# Patient Record
Sex: Male | Born: 1950 | Race: Asian | Hispanic: No | Marital: Married | Smoking: Former smoker
Health system: Southern US, Community
[De-identification: ages and names within clinical notes are randomized; demographics above are authoritative.]

## PROBLEM LIST (undated history)

## (undated) DIAGNOSIS — J189 Pneumonia, unspecified organism: Secondary | ICD-10-CM

## (undated) DIAGNOSIS — E785 Hyperlipidemia, unspecified: Secondary | ICD-10-CM

## (undated) DIAGNOSIS — I2510 Atherosclerotic heart disease of native coronary artery without angina pectoris: Secondary | ICD-10-CM

---

## 2001-09-28 ENCOUNTER — Encounter: Payer: Self-pay | Admitting: Family Medicine

## 2001-09-28 ENCOUNTER — Ambulatory Visit (HOSPITAL_COMMUNITY): Admission: RE | Admit: 2001-09-28 | Discharge: 2001-09-28 | Payer: Self-pay | Admitting: Family Medicine

## 2003-04-08 ENCOUNTER — Encounter: Payer: Self-pay | Admitting: Emergency Medicine

## 2003-04-08 ENCOUNTER — Emergency Department (HOSPITAL_COMMUNITY): Admission: EM | Admit: 2003-04-08 | Discharge: 2003-04-08 | Payer: Self-pay | Admitting: Emergency Medicine

## 2004-11-10 HISTORY — PX: CORONARY ANGIOPLASTY WITH STENT PLACEMENT: SHX49

## 2005-07-10 ENCOUNTER — Ambulatory Visit: Payer: Self-pay | Admitting: Internal Medicine

## 2005-07-22 ENCOUNTER — Ambulatory Visit: Payer: Self-pay | Admitting: Cardiology

## 2005-07-22 ENCOUNTER — Encounter: Payer: Self-pay | Admitting: Emergency Medicine

## 2005-07-23 ENCOUNTER — Inpatient Hospital Stay (HOSPITAL_COMMUNITY): Admission: AD | Admit: 2005-07-23 | Discharge: 2005-07-25 | Payer: Self-pay | Admitting: Cardiology

## 2005-07-23 ENCOUNTER — Ambulatory Visit: Payer: Self-pay | Admitting: Cardiology

## 2005-07-23 ENCOUNTER — Encounter: Payer: Self-pay | Admitting: Cardiology

## 2005-07-28 ENCOUNTER — Ambulatory Visit: Payer: Self-pay | Admitting: Internal Medicine

## 2005-08-05 ENCOUNTER — Ambulatory Visit: Payer: Self-pay | Admitting: Family Medicine

## 2005-08-08 ENCOUNTER — Ambulatory Visit: Payer: Self-pay | Admitting: Internal Medicine

## 2005-08-11 ENCOUNTER — Ambulatory Visit: Payer: Self-pay | Admitting: Internal Medicine

## 2007-12-01 ENCOUNTER — Ambulatory Visit: Payer: Self-pay | Admitting: Internal Medicine

## 2007-12-01 DIAGNOSIS — F1010 Alcohol abuse, uncomplicated: Secondary | ICD-10-CM | POA: Insufficient documentation

## 2007-12-01 DIAGNOSIS — E785 Hyperlipidemia, unspecified: Secondary | ICD-10-CM

## 2007-12-01 DIAGNOSIS — L259 Unspecified contact dermatitis, unspecified cause: Secondary | ICD-10-CM

## 2007-12-01 DIAGNOSIS — I2510 Atherosclerotic heart disease of native coronary artery without angina pectoris: Secondary | ICD-10-CM | POA: Insufficient documentation

## 2010-11-30 ENCOUNTER — Encounter: Payer: Self-pay | Admitting: Internal Medicine

## 2011-03-28 NOTE — H&P (Signed)
[name], SEBESTA NO.:  [id number]   MEDICAL RECORD NO.:  [id number]          PATIENT TYPE:  EMS   LOCATION:  ED                           FACILITY:  [facility]   PHYSICIAN:  Learta Codding, M.D. LHCDATE OF BIRTH:  1951/02/24   DATE OF ADMISSION:  07/22/2005  DATE OF DISCHARGE:                                HISTORY & PHYSICAL   REFERRING PHYSICIAN:  [name], M.D.   CARDIOLOGIST:  Learta Codding, M.D.   REASON FOR CONSULTATION:  Evaluation of heartburn in a 60 year old  [place] with marked EKG changes consistent with high risk anatomy.   HISTORY OF PRESENT ILLNESS:  The patient is a 60 year old [place] male  living in the [place] over the last 16 years.  The patient presents to  the emergency room with his wife and son.  The wife is a patient of Dr. Drue Novel.  His wife has been rather concerned about him over the last several years  because of ongoing and now accelerating symptoms of heartburn.  The  patient states that ever since an admission in 2004 at [facility] when he  presented with alcoholic intoxication and abdominal pain, he has been  experiencing intermittent heartburn.  He reports that this can occur in the  middle of the night while not doing anything and waking him up out of his  sleep.  Also, there is no associated nausea or vomiting.  There is also no  diaphoresis.  Interestingly, the patient states that when he lifts heavy  materials, he does not experience this problem, but when he mops floors or  walks a flight of stairs, he often experiences these symptoms of heartburn.  They typically last up to 30 minutes, sometimes even up to an hour and tend  to go away with rest.  He never really sought medical attention for this  problem over the last several years, short of his admission in 2004.  He did  see Dr. Drue Novel a few weeks ago and had some blood work done with a markedly  elevated LDL.  It was felt the patient was experiencing  gastroesophageal  reflux but is not on any specific treatment.  Today in the emergency room,  he states that his last episode of heartburn was yesterday, which was rather  prolonged.  This occurred after eating a lot of hot peppers, according to  his wife; however, his EKG today showed marked EKG change with T wave  inversion across the anterior precordial leads but not in the inferior  leads, consistent with high risk anatomy with proximal LAD disease.  The  patient is currently painfree.  He also on admission has a positive troponin  point-of-care marker.   ALLERGIES:  No known drug allergies.   MEDICATIONS:  None.   FAMILY HISTORY:  The patient has no family history of premature coronary  artery disease.   SOCIAL HISTORY:  The patient is a Designer, fashion/clothing.  He smokes at least a pack a day,  sometimes up to a pack and a half.  He has been  doing so for many years.  He also has a history of heavy alcohol consumption and drinks up to two  cases a day over the last several years.  The patient has one son who in  good health.   REVIEW OF SYSTEMS:  As in HPI.  No nausea, vomiting, heartburn.  No definite  chest pain.  No exertional dyspnea.  No orthopnea or PND.  No palpitations  or syncope.  Significant weight loss, according to the patient's wife, with  positive dysphagia and decreased taste of foods.  He denies any dysuria or  frequency.  He has no melena or hematochezia.  He denies any neurological  symptoms.  Review of systems is otherwise negative.   PHYSICAL EXAMINATION:  VITAL SIGNS:  Blood pressure 124/70, heart rate 75  beats per minute.  GENERAL:  A somewhat cachectic-appearing [place] male in no distress.  HEENT:  Pupils are equal and reactive to light.  Conjunctivae are clear.  NECK:  Supple.  No carotid upstroke.  No carotid bruits.  LUNGS:  Clear bilaterally.  HEART:  Regular rate and rhythm with a soft systolic murmur at the right  upper sternal border.  Normal S1 and S2.   No definite S3 or S4.  No  pathological murmurs.  PMI is nondisplaced.  ABDOMEN:  Soft and nontender.  There is no rebound or guarding.  There are  good bowel sounds.  No hepatosplenomegaly.  EXTREMITIES:  No clubbing, cyanosis or edema.  Peripheral pulses are 2+.  Femoral pulses are 2+ as well as dorsalis pedis and posterior tibial.  NEUROLOGIC:  Grossly nonfocal.   A 12-lead electrocardiogram showed a normal sinus rhythm with marked T wave  changes across the anterior precordial leads.  See description above.   LABS:  LDL is 146.  Hemoglobin is 14.3, white count 6.9, platelet count 224.  Sodium 133, potassium 3.4, chloride 100, CO2 26, glucose 100, INR 0.8, BUN  13, creatinine 1.  Total bilirubin 0.6.  Alkaline phosphatase 62.  SGOT 40.  SGPT 19.  Total protein 6.4, albumin 3.4, calcium 8.8, lipase 20.  Troponin  0.35.  Triglycerides 105.  HDL 62.2.   PROBLEM LIST:  1.  Atypical chest pain with heartburn.      1.  Marked electrocardiogram changes with T wave inversion in the          anterior precordial leads.  ([place])      2.  Unlikely to represent apical hypertrophic cardiomyopathy, given the          lack of EKG changes in the inferior leads and marked changes          compared to an ECG from 2004.      3.  Positive cardiac troponin.  (?NSTEMI days ago).  2.  Heavy alcohol consumption.      1.  Two cases of beer a day.      2.  Elevated LFTs.      3.  Admission for abdominal pain in 2004.  3.  Dyslipidemia.  4.  Heavy tobacco use.  5.  Weight loss and dysphagia.      1.  Rule out GI pathology.   PLAN:  1.  The patient has an EKG that is consistent with high-risk coronary      anatomy.  He will be admitted for further evaluation.  2.  Patient will be started on aspirin, heparin, nitro paste, and beta      blocker therapy.  He  will also be placed on telemetry. 3.  Cardiac catheterization in the morning.  The risks and benefits of this      procedure were  discussed with the      patient and his wife, who acted as a Nurse, learning disability, and they both agreed to      proceed to procedure in the morning.  4.  Further GI evaluation as outpatient  may be indicated with weight loss,      dysphagia and abnormal LFT's.  Can be coordinated through Dr. Drue Novel.      Learta Codding, M.D. [facility]  Electronically Signed     GED/MEDQ  D:  07/22/2005  T:  07/22/2005  Job:  [id number]   cc:   [name], MD [facility]  [phone] [address] [place], [place] [address]

## 2011-03-28 NOTE — Discharge Summary (Signed)
NAMEROSE, HIPPLER NO.:  [id number]   MEDICAL RECORD NO.:  [id number]          PATIENT TYPE:  INP   LOCATION:  6523                         FACILITY:  [facility]   PHYSICIAN:  Charlies Constable, M.D. [facility] DATE OF BIRTH:  Nov 03, 1951   DATE OF ADMISSION:  07/23/2005  DATE OF DISCHARGE:                                 DISCHARGE SUMMARY   PRIMARY CARE PHYSICIAN:  [name], MD.   DISCHARGE DIAGNOSES:  Coronary artery disease, status post stent to the  proximal left anterior descending artery; decreasing stenosis from 90% to  less than 10% (using a bare metal stent).  Recent non-ST segment elevation  myocardial infarction, with a 90% stenosis in the proximal left anterior  descending artery, 30% narrowing in the proximal circumflex artery with a  50% narrowing in the proximal branch of the circumflex artery.  Nondominant  right coronary artery and a normal left ventricular function.   PAST MEDICAL HISTORY:  1.  Heavy alcohol consumption in the form of 2 cases of beer a day, with      elevated LFTs.  2.  Dyslipidemia.  3.  Heavy tobacco use.  4.  Weight loss.  5.  Dysphagia.   Further TIA evaluation as an outpatient may be indicated with weight loss  dysphagia and abnormal LFTs.  This can be coordinated through Dr. Drue Novel.   ALLERGIES:  NO KNOWN DRUG ALLERGIES.   HOSPITAL COURSE:  Mr. Pandit is a 59 year old [place] gentleman living in  the [place] for the last 16 years.  Recently he was treated for chest  pain with an EKG that showed inferior and lateral P-wave changes, with a  positive Troponin.  The patient was taken to the catheterization lab by Dr.  Charlies Constable on July 24, 2005; results as stated above.  The patient  tolerated the procedure without complications.  He is being discharged home.   FOLLOW-UP:  Ok Anis, NP on August 08, 2005 at 10:30 a.m.   LAB WORK THIS ADMISSION:  Last hemoglobin 14.0, hematocrit 42.0.  The  patient  with positive point-of-care enzymes (Troponin at 0.74 and a relative  index of 2.6).  Troponin peaked at 1.07.  Other lab work includes:  Potassium 3.4 initially, glucose 100, BUN 13, creatinine 1.0, total  bilirubin 0.6.  ASGOT 40, ASGPT 19.   DISCHARGE MEDICATIONS:  1.  Plavix 75 mg daily.  2.  Aspirin 325 mg daily.  3.  Lopressor 25 mg 1/2 tablet p.o. b.i.d.  4.  Simvastatin 40 mg daily.   SPECIAL INSTRUCTIONS:  The patient has also been given the [facility] heart  care discharge instructions.  He has been told to call our office if he has  any problems with his catheterization site.  I have also written an order  for cardiac rehabilitation phase II.      Dorian Pod, NP    ______________________________  Charlies Constable, M.D. [facility]    MB/MEDQ  D:  07/25/2005  T:  07/25/2005  Job:  [id number]   cc:   [name], MD [facility]  [address] [place], [place] [address]

## 2011-03-28 NOTE — Cardiovascular Report (Signed)
[name], [name] NO.:  [id number]   MEDICAL RECORD NO.:  [id number]          PATIENT TYPE:  INP   LOCATION:  6523                         FACILITY:  [facility]   PHYSICIAN:  Charlies Constable, M.D. [facility] DATE OF BIRTH:  1951/07/26   DATE OF PROCEDURE:  07/24/2005  DATE OF DISCHARGE:                              CARDIAC CATHETERIZATION   CLINICAL HISTORY:  Mr. Hardy is 60 years old and has no prior history of  known heart disease.  He was admitted to [facility] Emergency Room  with chest pain and an ECG that showed inferior and lateral P-wave changes  and he had a positive troponin.  He was seen in consultation by Learta Codding, M.D., and scheduled for catheterization today.  He also has heavy  alcohol consumption of up to two cases of beer per day.  He has had abnormal  liver function tests and heavy tobacco use.  He came to the [place]  from [place] 16 years ago.   PROCEDURE:  The procedure was performed via the right femoral arteries and  arterial sheath and 6 French preformed coronary catheters.  A front wall  arterial puncture was performed and Omnipaque contrast was used.  After  completion of the diagnostic study, we decided to proceed with intervention  on the left anterior descending artery lesion.   Patient was given Angiomax bolus and infusion.  We gave 600 mg of Plavix but  did not remember to give it until the end of the procedure.  We used a 6  [place] Q4 guiding catheter with side holes and a Prowater wire.  We crossed  the lesion in the proximal LAD with the wire without difficulty.  We direct  stented with a 3.0 x 15 mm VISION stent deploying this with one inflation of  16 atmospheres for 30 seconds.  We then post dilated with a 3.5 x 12 mm  Quantum Maverick performing two inflations of 16 atmospheres for 30 seconds.  Final diagnostic study was then performed through the guiding catheter.  Patient tolerated the procedure well and left  the laboratory in satisfactory  condition. We did not close the right femoral artery at the end of the  procedure.   RESULTS:  The aortic pressure was 105/66 with a mean of 82.  Left  ventricular pressure was 105/10.   Left main coronary artery:  Left main coronary artery was free of  significant disease.   Left anterior descending:  The left anterior descending artery gave rise to  three diagonal branches and three septal perforators.  There was a 90%  stenosis in the proximal LAD after the first diagonal branch located at the  first septal perforator.   Circumflex artery:  The circumflex artery was a dominant vessel that gave  rise to a large marginal branch, posterolateral branch and a posterior  descending branch.  There was 30% proximal stenosis in the circumflex  artery.  There was 50% narrowing in the marginal branch and one of its  subbranches.   Right coronary artery:  The right  coronary artery was a nondominant vessel  that was free of major obstruction.   Left ventriculogram:  The left ventriculogram was performed in the RAO  projection, showed good wall motion with no areas of  hypokinesis.  The  estimated ejection fraction was 60%.   Following stenting of the lesion in the proximal LAD, the stenosis improved  from 90% to less than 10%.   CONCLUSION:  1.  Coronary artery disease with recent non-ST segment elevation myocardial      infarction with 90% stenosis in proximal left anterior descending      artery, 30% narrowing in the proximal circumflex artery with 50%      narrowing in the proximal branch of the circumflex artery, a nondominant      right coronary artery and normal left ventricular function.  2.  Successful percutaneous coronary intervention of the lesion in the      proximal left anterior descending using a bare metal stent with      improvement in percent of narrowing from 90% to less than 10%.   DISPOSITION:  Patient taken to the post anesthesia  care unit for further  observation.  We chose the bare metal stent because the vessel size was  large and because we were concerned about compliance with history of alcohol  abuse.           ______________________________  Charlies Constable, M.D. [facility]     BB/MEDQ  D:  07/24/2005  T:  07/24/2005  Job:  [id number]   cc:   [name], MD [facility]  [phone] [place], [place] [address]   Cardiopulmonary Lab

## 2016-02-15 ENCOUNTER — Emergency Department (HOSPITAL_COMMUNITY): Payer: Managed Care, Other (non HMO)

## 2016-02-15 ENCOUNTER — Inpatient Hospital Stay (HOSPITAL_COMMUNITY)
Admission: EM | Admit: 2016-02-15 | Discharge: 2016-02-19 | DRG: 190 | Disposition: A | Discharge status: Home / Self Care | Payer: Managed Care, Other (non HMO) | Attending: Family Medicine | Admitting: Family Medicine

## 2016-02-15 ENCOUNTER — Encounter (HOSPITAL_COMMUNITY): Payer: Self-pay | Admitting: Emergency Medicine

## 2016-02-15 DIAGNOSIS — J449 Chronic obstructive pulmonary disease, unspecified: Secondary | ICD-10-CM

## 2016-02-15 DIAGNOSIS — R0902 Hypoxemia: Secondary | ICD-10-CM | POA: Diagnosis not present

## 2016-02-15 DIAGNOSIS — Z681 Body mass index (BMI) 19 or less, adult: Secondary | ICD-10-CM

## 2016-02-15 DIAGNOSIS — J441 Chronic obstructive pulmonary disease with (acute) exacerbation: Principal | ICD-10-CM | POA: Diagnosis present

## 2016-02-15 DIAGNOSIS — I2510 Atherosclerotic heart disease of native coronary artery without angina pectoris: Secondary | ICD-10-CM | POA: Diagnosis not present

## 2016-02-15 DIAGNOSIS — J9601 Acute respiratory failure with hypoxia: Secondary | ICD-10-CM | POA: Diagnosis present

## 2016-02-15 DIAGNOSIS — D696 Thrombocytopenia, unspecified: Secondary | ICD-10-CM | POA: Diagnosis present

## 2016-02-15 DIAGNOSIS — I10 Essential (primary) hypertension: Secondary | ICD-10-CM | POA: Diagnosis present

## 2016-02-15 DIAGNOSIS — E43 Unspecified severe protein-calorie malnutrition: Secondary | ICD-10-CM | POA: Insufficient documentation

## 2016-02-15 DIAGNOSIS — E785 Hyperlipidemia, unspecified: Secondary | ICD-10-CM | POA: Diagnosis present

## 2016-02-15 DIAGNOSIS — F17200 Nicotine dependence, unspecified, uncomplicated: Secondary | ICD-10-CM | POA: Diagnosis present

## 2016-02-15 DIAGNOSIS — J189 Pneumonia, unspecified organism: Secondary | ICD-10-CM | POA: Diagnosis not present

## 2016-02-15 DIAGNOSIS — Z23 Encounter for immunization: Secondary | ICD-10-CM

## 2016-02-15 HISTORY — DX: Pneumonia, unspecified organism: J18.9

## 2016-02-15 HISTORY — DX: Atherosclerotic heart disease of native coronary artery without angina pectoris: I25.10

## 2016-02-15 HISTORY — DX: Hyperlipidemia, unspecified: E78.5

## 2016-02-15 LAB — HEPATIC FUNCTION PANEL
ALBUMIN: 3.7 g/dL (ref 3.5–5.0)
ALT: 15 U/L — AB (ref 17–63)
AST: 27 U/L (ref 15–41)
Alkaline Phosphatase: 56 U/L (ref 38–126)
BILIRUBIN INDIRECT: 0.6 mg/dL (ref 0.3–0.9)
Bilirubin, Direct: 0.2 mg/dL (ref 0.1–0.5)
TOTAL PROTEIN: 7 g/dL (ref 6.5–8.1)
Total Bilirubin: 0.8 mg/dL (ref 0.3–1.2)

## 2016-02-15 LAB — URINALYSIS, ROUTINE W REFLEX MICROSCOPIC
BILIRUBIN URINE: NEGATIVE
Glucose, UA: NEGATIVE mg/dL
Hgb urine dipstick: NEGATIVE
Ketones, ur: NEGATIVE mg/dL
LEUKOCYTES UA: NEGATIVE
NITRITE: NEGATIVE
Protein, ur: NEGATIVE mg/dL
pH: 6 (ref 5.0–8.0)

## 2016-02-15 LAB — CBC
HEMATOCRIT: 46.3 % (ref 39.0–52.0)
HEMOGLOBIN: 14.2 g/dL (ref 13.0–17.0)
MCH: 24.8 pg — AB (ref 26.0–34.0)
MCHC: 30.7 g/dL (ref 30.0–36.0)
MCV: 80.8 fL (ref 78.0–100.0)
Platelets: 140 10*3/uL — ABNORMAL LOW (ref 150–400)
RBC: 5.73 MIL/uL (ref 4.22–5.81)
RDW: 14.5 % (ref 11.5–15.5)
WBC: 5.5 10*3/uL (ref 4.0–10.5)

## 2016-02-15 LAB — BRAIN NATRIURETIC PEPTIDE: B NATRIURETIC PEPTIDE 5: 181.2 pg/mL — AB (ref 0.0–100.0)

## 2016-02-15 LAB — I-STAT TROPONIN, ED: Troponin i, poc: 0 ng/mL (ref 0.00–0.08)

## 2016-02-15 LAB — BASIC METABOLIC PANEL
ANION GAP: 10 (ref 5–15)
BUN: 8 mg/dL (ref 6–20)
CHLORIDE: 91 mmol/L — AB (ref 101–111)
CO2: 35 mmol/L — AB (ref 22–32)
Calcium: 8.9 mg/dL (ref 8.9–10.3)
Creatinine, Ser: 0.93 mg/dL (ref 0.61–1.24)
GFR calc non Af Amer: >60 mL/min (ref >60)
GLUCOSE: 130 mg/dL — AB (ref 65–99)
POTASSIUM: 4.4 mmol/L (ref 3.5–5.1)
Sodium: 136 mmol/L (ref 135–145)

## 2016-02-15 LAB — INFLUENZA PANEL BY PCR (TYPE A & B)
H1N1FLUPCR: NOT DETECTED
INFLAPCR: NEGATIVE
Influenza B By PCR: NEGATIVE

## 2016-02-15 LAB — LIPASE, BLOOD: LIPASE: 20 U/L (ref 11–51)

## 2016-02-15 MED ORDER — ASPIRIN EC 81 MG PO TBEC
81.0000 mg | DELAYED_RELEASE_TABLET | Freq: Every day | ORAL | Status: DC
  Administered 2016-02-15 – 2016-02-19 (×5): 81 mg via ORAL
  Filled 2016-02-15 (×5): qty 1

## 2016-02-15 MED ORDER — TRAZODONE HCL 50 MG PO TABS
25.0000 mg | ORAL_TABLET | Freq: Every evening | ORAL | Status: DC | PRN
  Administered 2016-02-16 – 2016-02-18 (×3): 25 mg via ORAL
  Filled 2016-02-15 (×3): qty 1

## 2016-02-15 MED ORDER — SODIUM CHLORIDE 0.9 % IV SOLN
INTRAVENOUS | Status: DC
  Administered 2016-02-15: 14:00:00 via INTRAVENOUS

## 2016-02-15 MED ORDER — HYDROCODONE-ACETAMINOPHEN 5-325 MG PO TABS: 1.0000 | ORAL_TABLET | ORAL | Status: DC | PRN

## 2016-02-15 MED ORDER — METHYLPREDNISOLONE SODIUM SUCC 40 MG IJ SOLR
40.0000 mg | Freq: Four times a day (QID) | INTRAMUSCULAR | Status: DC
  Administered 2016-02-15 – 2016-02-17 (×8): 40 mg via INTRAVENOUS
  Filled 2016-02-15 (×7): qty 1

## 2016-02-15 MED ORDER — ONDANSETRON HCL 4 MG/2ML IJ SOLN
4.0000 mg | Freq: Four times a day (QID) | INTRAMUSCULAR | Status: DC | PRN
Start: 2016-02-15 — End: 2016-02-19

## 2016-02-15 MED ORDER — PNEUMOCOCCAL VAC POLYVALENT 25 MCG/0.5ML IJ INJ
0.5000 mL | INJECTION | INTRAMUSCULAR | Status: AC
  Administered 2016-02-16: 0.5 mL via INTRAMUSCULAR
  Filled 2016-02-15: qty 0.5

## 2016-02-15 MED ORDER — ACETAMINOPHEN 650 MG RE SUPP: 650.0000 mg | Freq: Four times a day (QID) | RECTAL | Status: DC | PRN

## 2016-02-15 MED ORDER — ONDANSETRON HCL 4 MG PO TABS: 4.0000 mg | ORAL_TABLET | Freq: Four times a day (QID) | ORAL | Status: DC | PRN

## 2016-02-15 MED ORDER — ACETAMINOPHEN 325 MG PO TABS
650.0000 mg | ORAL_TABLET | Freq: Four times a day (QID) | ORAL | Status: DC | PRN
Start: 2016-02-15 — End: 2016-02-19

## 2016-02-15 MED ORDER — MAGNESIUM CITRATE PO SOLN: 1.0000 | Freq: Once | ORAL | Status: DC | PRN

## 2016-02-15 MED ORDER — ENSURE ENLIVE PO LIQD: 237.0000 mL | Freq: Three times a day (TID) | ORAL | Status: DC

## 2016-02-15 MED ORDER — BISACODYL 10 MG RE SUPP: 10.0000 mg | Freq: Every day | RECTAL | Status: DC | PRN

## 2016-02-15 MED ORDER — IPRATROPIUM-ALBUTEROL 0.5-2.5 (3) MG/3ML IN SOLN
3.0000 mL | Freq: Once | RESPIRATORY_TRACT | Status: AC
  Administered 2016-02-15: 3 mL via RESPIRATORY_TRACT
  Filled 2016-02-15: qty 3

## 2016-02-15 MED ORDER — SENNOSIDES-DOCUSATE SODIUM 8.6-50 MG PO TABS
1.0000 | ORAL_TABLET | Freq: Every evening | ORAL | Status: DC | PRN
  Administered 2016-02-16: 1 via ORAL
  Filled 2016-02-15 (×2): qty 1

## 2016-02-15 MED ORDER — ENOXAPARIN SODIUM 40 MG/0.4ML ~~LOC~~ SOLN
40.0000 mg | SUBCUTANEOUS | Status: DC
Start: 2016-02-15 — End: 2016-02-19
  Administered 2016-02-15 – 2016-02-19 (×5): 40 mg via SUBCUTANEOUS
  Filled 2016-02-15 (×6): qty 0.4

## 2016-02-15 MED ORDER — NICOTINE 21 MG/24HR TD PT24
21.0000 mg | MEDICATED_PATCH | Freq: Every day | TRANSDERMAL | Status: DC
  Administered 2016-02-15 – 2016-02-19 (×5): 21 mg via TRANSDERMAL
  Filled 2016-02-15 (×5): qty 1

## 2016-02-15 MED ORDER — SODIUM CHLORIDE 0.9% FLUSH
3.0000 mL | Freq: Two times a day (BID) | INTRAVENOUS | Status: DC
  Administered 2016-02-15 – 2016-02-19 (×7): 3 mL via INTRAVENOUS

## 2016-02-15 MED ORDER — METHYLPREDNISOLONE SODIUM SUCC 125 MG IJ SOLR
125.0000 mg | Freq: Once | INTRAMUSCULAR | Status: AC
  Administered 2016-02-15: 125 mg via INTRAVENOUS
  Filled 2016-02-15: qty 2

## 2016-02-15 MED ORDER — GUAIFENESIN ER 600 MG PO TB12
600.0000 mg | ORAL_TABLET | Freq: Two times a day (BID) | ORAL | Status: DC
  Administered 2016-02-15 – 2016-02-19 (×9): 600 mg via ORAL
  Filled 2016-02-15 (×9): qty 1

## 2016-02-15 MED ORDER — MORPHINE SULFATE (PF) 2 MG/ML IV SOLN: 1.0000 mg | INTRAVENOUS | Status: DC | PRN

## 2016-02-15 MED ORDER — SODIUM CHLORIDE 0.9 % IV SOLN
INTRAVENOUS | Status: DC
  Administered 2016-02-15 – 2016-02-17 (×3): via INTRAVENOUS

## 2016-02-15 MED ORDER — IOPAMIDOL (ISOVUE-370) INJECTION 76%
INTRAVENOUS | Status: AC
  Administered 2016-02-15: 80 mL
  Filled 2016-02-15: qty 100

## 2016-02-15 NOTE — ED Notes (Signed)
Pt having SOB for almost a month getting worse tonight pt states he is not able to move without feeling SOB and like he will pass out. Pt denies any fever or chills, denies CP at this time.

## 2016-02-15 NOTE — Progress Notes (Signed)
Initial Nutrition Assessment  DOCUMENTATION CODES:   Underweight, Severe malnutrition in context of chronic illness  INTERVENTION:   -Ensure Enlive po TID, each supplement provides 350 kcal and 20 grams of protein  NUTRITION DIAGNOSIS:   Malnutrition related to chronic illness as evidenced by severe depletion of body fat, severe depletion of muscle mass.  GOAL:   Patient will meet greater than or equal to 90% of their needs  MONITOR:   Supplement acceptance, PO intake, Labs, Weight trends, Skin, I & O's  REASON FOR ASSESSMENT:   Consult Assessment of nutrition requirement/status  ASSESSMENT:   [name] is a 65 y.o. [place] male, non English speaking, heavy smoker, with a history of remote PNA, Recent ETOH abuse, HLD, and h/o CAD s/p stent on 2006 presenting with 6 month history of progressive dyspnea to the point of becoming short of breath at rest. He denies any fever, chills, night sweats, or hemoptysis. He reports productive cough, mostly saliva. He admits to sick contacts, some of them may have had the flu. Reports significant weight loss with decreased appetite; denies any nausea or vomiting. He denies any chest pain or palpitations. No confusion is reported. He does has generalized weakness and occasional dizziness without vertigo, as he is becoming increasingly deconditioned. CT head is negative.   Pt admitted with respiratory failure due to questionable underlying COPD.   Spoke with pt and pt wife at bedside. Both confirm a general decline in health over the past 5 years and more significant decline over the past year. Pt wife reports that pt is very sedentary due to weakness and difficulty breathing. She reports pt ate almost nothing within the past 2 days and typically eats small portions at meals. She recently started providing Ensure once or twice a day for the past month. Pt wife is optimistic that pt is eating better due to improved respiratory status; pt  consumed 2 drumsticks, chocolate chip cookie, and mashed potatoes from [facility] just prior to RD visit.   Pt wife reports that pt's UBW is around 150#. She estimates pt has lost about 30# within the past years, however, there is no data to confirm weight changes.   Nutrition-Focused physical exam completed. Findings are severe fat depletion, severe muscle depletion, and no edema. Pt has become more sedentary due to weakness. 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  Discussed importance of good meal and supplement intake to promote healing. Both are amenable to continuing Ensure supplements during hospitalization and at home (pt prefers vanilla flavor).   Labs reviewed.  Diet Order:  Diet regular Room service appropriate?: Yes; Fluid consistency:: Thin  Skin:  Reviewed, no issues  Last BM:  PTA  Height:   Ht Readings from Last 1 Encounters:  02/15/16 5\' 4"  (1.626 m)    Weight:   Wt Readings from Last 1 Encounters:  02/15/16 105 lb 1.6 oz (47.673 kg)    Ideal Body Weight:  59.1 kg  BMI:  Body mass index is 18.03 kg/(m^2).  Estimated Nutritional Needs:   Kcal:  1400-1600  Protein:  65-80 grams  Fluid:  1.4-1.6 L  EDUCATION NEEDS:   Education needs addressed  Donnalynn Wheeless A. Mayford KnifeWilliams, RD, LDN, CDE Pager: [phone] After hours Pager: [phone]

## 2016-02-15 NOTE — H&P (Signed)
[facility] History and Physical  [name] ZOX:[id number] DOB: September 27, 1951 DOA: 02/15/2016  Referring physician:  PCP: Leonard Ora, MD   Chief Complaint: Shortness of breath  HPI: [name] is a 65 y.o. [place]  male, non English speaking, heavy smoker, with a history of remote PNA,  Recent ETOH abuse, HLD, and h/o CAD s/p stent on 2006 presenting with 6 month history of progressive dyspnea to the point of becoming short of breath at rest. He denies any fever, chills, night sweats, or hemoptysis. He reports productive cough, mostly saliva. He admits to sick contacts, some of them may have had the flu. Reports significant weight loss with decreased appetite;  denies any nausea or vomiting. He denies any chest pain or palpitations. No confusion is reported. He does has generalized weakness and occasional dizziness without vertigo, as he is becoming increasingly deconditioned. CT head is negative.  At the ED. CXR was remarkable for atelectasis without any infiltrates or acute findings. CT angio chest today  was negative for acute PE. Dilated ascending thoracic aorta and arch without dissection was seen. Osats were at 84% at rest, improved with O2 Mather 2 L to 97, as well as with IV Solumedrol and Breathing treatments.  He is being admitted for the management of likely undiagnosed COPD   Review of Systems:  See HPI for significant positives. All other systems were reviewed and are negative.  Past Medical History  Diagnosis Date  . CAD (coronary artery disease)   . PNA (pneumonia)    Past Surgical History  Procedure Laterality Date  . Coronary angioplasty with stent placement     Social History:  reports that he has been smoking.  He does not have any smokeless tobacco history on file. He reports that he does not drink alcohol or use illicit drugs.   No Known Allergies  History reviewed. No pertinent family history.   Prior to Admission medications   Not on File   Physical  Exam: Filed Vitals:   02/15/16 1035 02/15/16 1100 02/15/16 1130 02/15/16 1200  BP: 113/84 128/86 130/82 124/87  Pulse: 92 95 89 86  Resp: Height:      Weight:      SpO2: 100% 100% 100% 98%    Wt Readings from Last 3 Encounters:  02/15/16 58.968 kg (130 lb)  12/01/07 58.968 kg (130 lb)    General: Appears calm and comfortable,  Very thin Eyes:  PERRL, EOMI, normal lids, iris ENT: grossly normal hearing, lips & tongue Neck: no lymphadenopathy, masses or thyromegaly Cardiovascular: regular rate and rythm, no murmurs, rubs or gallops. No lower extremity edema   Respiratory:  Mild exp wheezing, no rhonhci or rales. Normal respiratory effort. Abdomen: soft,non-tender, normal bowel sounds Skin: no rash or induration seen on limited exam. No open lesions. Musculoskeletal:  grossly normal tone in both upper and lower extremities Psychiatric: grossly normal mood and affect, speech fluent and appropriate Neurologic: CN 2-12 grossly intact, moves all extremities in coordinated fashion.          Labs on Admission:  Basic Metabolic Panel:  Recent Labs Lab 02/15/16 0708  NA 136  K 4.4  CL 91*  CO2 35*  GLUCOSE 130*  BUN 8  CREATININE 0.93  CALCIUM 8.9    Liver Function Tests:  Recent Labs Lab 02/15/16 0851  AST 27  ALT 15*  ALKPHOS 56  BILITOT 0.8  PROT 7.0  ALBUMIN 3.7    Recent Labs Lab 02/15/16  1610  LIPASE 20   No results for input(s): AMMONIA in the last 168 hours.  CBC:  Recent Labs Lab 02/15/16 0708  WBC 5.5  HGB 14.2  HCT 46.3  MCV 80.8  PLT 140*    Cardiac Enzymes: No results for input(s): CKTOTAL, CKMB, CKMBINDEX, TROPONINI in the last 168 hours.  BNP (last 3 results)  Recent Labs  02/15/16 0708  BNP 181.2*    ProBNP (last 3 results) No results for input(s): PROBNP in the last 8760 hours.   CREATININE: 0.93 (02/15/16 0708) Estimated creatinine clearance - 67 mL/min  CBG: No results for input(s): GLUCAP in the  last 168 hours.  Radiological Exams on Admission: Dg Chest 2 View  02/15/2016  CLINICAL DATA:  65 year old male with acute shortness of Breath. Initial encounter. EXAM: CHEST  2 VIEW COMPARISON:  07/22/2005. FINDINGS: Normal cardiac and mediastinal contours aside from prominence of the ascending aortic contour. Visualized tracheal air column is within normal limits. Hyperinflation today. No pneumothorax or pulmonary edema. No pleural effusion or confluent pulmonary opacity. No acute osseous abnormality identified. IMPRESSION: 1. Hyperinflation suggesting emphysema, new since 2006. No superimposed acute pulmonary process identified. 2. Questionable chronic enlargement of the ascending aorta. Other mediastinal contours are normal. Electronically Signed   By: Odessa Fleming M.D.   On: 02/15/2016 07:47   Ct Head Wo Contrast  02/15/2016  CLINICAL DATA:  65 year old male with shortness of breath and weakness for 1 month, progressive. Initial encounter. EXAM: CT HEAD WITHOUT CONTRAST TECHNIQUE: Contiguous axial images were obtained from the base of the skull through the vertex without intravenous contrast. COMPARISON:  None. FINDINGS: Minor paranasal sinus mucosal thickening. Tympanic cavities and mastoids are clear. No osseous abnormality identified. Visualized orbits and scalp soft tissues are within normal limits. Fairly extensive Calcified atherosclerosis at the skull base. Small chronic lacunar infarcts in the inferior left cerebellum, left PICA territory. Slightly asymmetric volume loss along the left sylvian fissure, no definite supratentorial cortical encephalomalacia. No ventriculomegaly. No midline shift, mass effect, or evidence of intracranial mass lesion. No acute intracranial hemorrhage identified. No cortically based acute infarct identified. No suspicious intracranial vascular hyperdensity. 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 No acute intracranial abnormality identified.  Electronically Signed   By: Odessa Fleming M.D.   On: 02/15/2016 09:33   Ct Angio Chest Pe W/cm &/or Wo Cm  02/15/2016  CLINICAL DATA:  64 YOM. Presents with SOB with weakness for the past month, worsening recently. Hx CAD. Isovue 370 80ml EXAM: CT ANGIOGRAPHY CHEST WITH CONTRAST TECHNIQUE: Multidetector CT imaging of the chest was performed using the standard protocol during bolus administration of intravenous contrast. Multiplanar CT image reconstructions and MIPs were obtained to evaluate the vascular anatomy. CONTRAST:  80 mL Isovue 370 IV COMPARISON:  None available FINDINGS: Vascular: Left arm IV contrast injection. Innominate vein and SVC patent. Mild right atrial enlargement with reflux of contrast into hepatic veins. Prominent right ventricle. Borderline dilatation of central pulmonary arteries. Satisfactory opacification of pulmonary arteries noted, and there is no evidence of pulmonary emboli. Patent bilateral pulmonary veins drain to the left atrium. Moderate scattered coronary calcifications. Incomplete opacification of the thoracic aorta due to scan timing. Ascending segment measures 4.1 cm maximum transverse diameter, proximal arch 3.3 cm, distal arch/ proximal descending 3 cm. No suggestion of dissection. Classic 3 vessel brachiocephalic arterial origin anatomy. Scattered calcified plaque in the arch and descending, and suprarenal abdominal segments. Mediastinum/Lymph Nodes: No masses or pathologically enlarged lymph nodes identified. No  pericardial effusion. Lungs/Pleura: Advanced pulmonary emphysematous changes most marked in the upper lobes. Small calcified granuloma, anterior basal segment left lower lobe image 125/5. No nodule or focal infiltrate. Upper abdomen: No acute findings. Musculoskeletal: No chest wall mass or suspicious bone lesions identified. Spondylitic changes in the visualized upper lumbar spine. Review of the MIP images confirms the above findings. IMPRESSION: 1. Negative for acute  PE. No suggestion of thoracic aortic dissection. 2. Dilated ascending thoracic aorta and arch. Recommend annual imaging followup by CTA or MRA. This recommendation follows 2010 ACCF/AHA/AATS/ACR/ASA/SCA/SCAI/SIR/STS/SVM Guidelines for the Diagnosis and Management of Patients with Thoracic Aortic Disease. Circulation. 2010; 121: [id number] 3. Advanced pulmonary emphysema. 4. Atherosclerosis, including aortic and coronary artery disease. Please note that although the presence of coronary artery calcium documents the presence of coronary artery disease, the severity of this disease and any potential stenosis cannot be assessed on this non-gated CT examination. Assessment for potential risk factor modification, dietary therapy or pharmacologic therapy may be warranted, if clinically indicated. Electronically Signed   By: Corlis Leak  Hassell M.D.   On: 02/15/2016 11:23    EKG: SR, QTC 479   Assessment/Plan Active Problems:   Hyperlipemia   CAD (coronary artery disease)   COPD (chronic obstructive pulmonary disease) (HCC)   PNA (pneumonia)  Acute respiratory failure with hypoxia in the setting of undiagnosed COPD, possible exposure to Flu. Osats were 84 RA at rest, improved with 2 L O2. He received IV Solumedrol and Nebs, improved symptoms - Admit to Tele obs Flu screen   Xopenex nebs - Steroids with Solumedrol 40 mg IV q 6 hrs   - Mucinex - O2 - CBC in am Incentive spirometry Will need OP Follow up with PCP  Tobacco abuse. 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 BNP 181.2   patient is cardiac pain free at this time. Will need further cardiac workup upon d/c    Thrombocytopenia: likely due to recent  ETOH; mild, 140 k at this time.  No bleeding issues reported. No transfusion indicated at this time.   Repeat CBC in am   Deconditioning  PT/OT/Nutrition Evaluation   Code Status: Full Code  DVT Prophylaxis: Lovenox  Family Communication: Wife at bedside Disposition Plan: Pending Improvement. Admitted for observation in tele bed. Expected LOS 24-48 hrs    [facility] LPWERTMAN,Leonard Luviano Hardy,[name] [facility] [email or web address] Password [email or web address]

## 2016-02-15 NOTE — ED Notes (Signed)
Pt returned from xray on RA O2 sats at 84%. Pt placed back on 2 L O2 nasal cannula. MD Zackowski made aware.

## 2016-02-15 NOTE — ED Provider Notes (Signed)
CSN: [id number]     Arrival date & time 02/15/16  [id number] History   First MD Initiated Contact with Patient 02/15/16 [phone]     Chief Complaint  Patient presents with  . Shortness of Breath     (Consider location/radiation/quality/duration/timing/severity/associated sxs/prior Treatment) Patient is a 65 y.o. male presenting with shortness of breath. The history is provided by the patient and the spouse.  Shortness of Breath Associated symptoms: no abdominal pain, no fever, no headaches, no rash and no vomiting   A patient with history of shortness of breath for several months, worse in the last several days. Patient's wife speaks good [place] and stated that he has been short of breath decreased appetite losing weight fatigued and tired all the time. No other acute illnesses. Patient is a heavy smoker. Past medical history known only for coronary artery disease and a past history of pneumonia.  Past Medical History  Diagnosis Date  . CAD (coronary artery disease)   . PNA (pneumonia)    Past Surgical History  Procedure Laterality Date  . Coronary angioplasty with stent placement     History reviewed. No pertinent family history. Social History  Substance Use Topics  . Smoking status: Current Every Day Smoker -- 0.50 packs/day  . Smokeless tobacco: None  . Alcohol Use: No    Review of Systems  Constitutional: Positive for appetite change, fatigue and unexpected weight change. Negative for fever.  HENT: Negative for congestion.   Eyes: Negative for visual disturbance.  Respiratory: Positive for shortness of breath.   Gastrointestinal: Negative for nausea, vomiting, abdominal pain and diarrhea.  Genitourinary: Negative for dysuria.  Musculoskeletal: Negative for back pain.  Skin: Negative for rash.  Neurological: Negative for headaches.  Hematological: Does not bruise/bleed easily.  Psychiatric/Behavioral: Negative for confusion.      Allergies  Review of patient's allergies  indicates no known allergies.  Home Medications   Prior to Admission medications   Not on File   BP 113/84 mmHg  Pulse 92  Resp 25  Ht 5\' 6"  (1.676 m)  Wt 58.968 kg  BMI 20.99 kg/m2  SpO2 100% Physical Exam  Constitutional: He is oriented to person, place, and time. He appears well-developed and well-nourished. No distress.  HENT:  Head: Normocephalic and atraumatic.  Mouth/Throat: Oropharynx is clear and moist.  Eyes: Conjunctivae and EOM are normal. Pupils are equal, round, and reactive to light.  Neck: Normal range of motion. Neck supple.  Cardiovascular: Normal rate, regular rhythm and normal heart sounds.   No murmur heard. Pulmonary/Chest: Effort normal and breath sounds normal.  Abdominal: Soft. Bowel sounds are normal. There is no tenderness.  Musculoskeletal: Normal range of motion. He exhibits no edema.  Neurological: He is alert and oriented to person, place, and time. No cranial nerve deficit. He exhibits normal muscle tone. Coordination normal.  Skin: Skin is warm. No rash noted.  Nursing note and vitals reviewed.   ED Course  Procedures (including critical care time) Labs Review Labs Reviewed  BASIC METABOLIC PANEL - Abnormal; Notable for the following:    Chloride 91 (*)    CO2 35 (*)    Glucose, Bld 130 (*)    All other components within normal limits  CBC - Abnormal; Notable for the following:    MCH 24.8 (*)    Platelets 140 (*)    All other components within normal limits  BRAIN NATRIURETIC PEPTIDE - Abnormal; Notable for the following:    B Natriuretic Peptide 181.2 (*)  All other components within normal limits  HEPATIC FUNCTION PANEL - Abnormal; Notable for the following:    ALT 15 (*)    All other components within normal limits  LIPASE, BLOOD  URINALYSIS, ROUTINE W REFLEX MICROSCOPIC (NOT AT [facility])  Rosezena Sensor, ED    Imaging Review Dg Chest 2 View  02/15/2016  CLINICAL DATA:  65 year old male with acute shortness of Breath.  Initial encounter. EXAM: CHEST  2 VIEW COMPARISON:  07/22/2005. FINDINGS: Normal cardiac and mediastinal contours aside from prominence of the ascending aortic contour. Visualized tracheal air column is within normal limits. Hyperinflation today. No pneumothorax or pulmonary edema. No pleural effusion or confluent pulmonary opacity. No acute osseous abnormality identified. IMPRESSION: 1. Hyperinflation suggesting emphysema, new since 2006. No superimposed acute pulmonary process identified. 2. Questionable chronic enlargement of the ascending aorta. Other mediastinal contours are normal. Electronically Signed   By: Odessa Fleming M.D.   On: 02/15/2016 07:47   Ct Head Wo Contrast  02/15/2016  CLINICAL DATA:  65 year old male with shortness of breath and weakness for 1 month, progressive. Initial encounter. EXAM: CT HEAD WITHOUT CONTRAST TECHNIQUE: Contiguous axial images were obtained from the base of the skull through the vertex without intravenous contrast. COMPARISON:  None. FINDINGS: Minor paranasal sinus mucosal thickening. Tympanic cavities and mastoids are clear. No osseous abnormality identified. Visualized orbits and scalp soft tissues are within normal limits. Fairly extensive Calcified atherosclerosis at the skull base. Small chronic lacunar infarcts in the inferior left cerebellum, left PICA territory. Slightly asymmetric volume loss along the left sylvian fissure, no definite supratentorial cortical encephalomalacia. No ventriculomegaly. No midline shift, mass effect, or evidence of intracranial mass lesion. No acute intracranial hemorrhage identified. No cortically based acute infarct identified. No suspicious intracranial vascular hyperdensity. 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 No acute intracranial abnormality identified. Electronically Signed   By: Odessa Fleming M.D.   On: 02/15/2016 09:33   Ct Angio Chest Pe W/cm &/or Wo Cm  02/15/2016  CLINICAL DATA:  64 YOM. Presents with  SOB with weakness for the past month, worsening recently. Hx CAD. Isovue 370 80ml EXAM: CT ANGIOGRAPHY CHEST WITH CONTRAST TECHNIQUE: Multidetector CT imaging of the chest was performed using the standard protocol during bolus administration of intravenous contrast. Multiplanar CT image reconstructions and MIPs were obtained to evaluate the vascular anatomy. CONTRAST:  80 mL Isovue 370 IV COMPARISON:  None available FINDINGS: Vascular: Left arm IV contrast injection. Innominate vein and SVC patent. Mild right atrial enlargement with reflux of contrast into hepatic veins. Prominent right ventricle. Borderline dilatation of central pulmonary arteries. Satisfactory opacification of pulmonary arteries noted, and there is no evidence of pulmonary emboli. Patent bilateral pulmonary veins drain to the left atrium. Moderate scattered coronary calcifications. Incomplete opacification of the thoracic aorta due to scan timing. Ascending segment measures 4.1 cm maximum transverse diameter, proximal arch 3.3 cm, distal arch/ proximal descending 3 cm. No suggestion of dissection. Classic 3 vessel brachiocephalic arterial origin anatomy. Scattered calcified plaque in the arch and descending, and suprarenal abdominal segments. Mediastinum/Lymph Nodes: No masses or pathologically enlarged lymph nodes identified. No pericardial effusion. Lungs/Pleura: Advanced pulmonary emphysematous changes most marked in the upper lobes. Small calcified granuloma, anterior basal segment left lower lobe image 125/5. No nodule or focal infiltrate. Upper abdomen: No acute findings. Musculoskeletal: No chest wall mass or suspicious bone lesions identified. Spondylitic changes in the visualized upper lumbar spine. Review of the MIP images confirms the above findings. IMPRESSION: 1. Negative for  acute PE. No suggestion of thoracic aortic dissection. 2. Dilated ascending thoracic aorta and arch. Recommend annual imaging followup by CTA or MRA. This  recommendation follows 2010 ACCF/AHA/AATS/ACR/ASA/SCA/SCAI/SIR/STS/SVM Guidelines for the Diagnosis and Management of Patients with Thoracic Aortic Disease. Circulation. 2010; 121: [id number] 3. Advanced pulmonary emphysema. 4. Atherosclerosis, including aortic and coronary artery disease. Please note that although the presence of coronary artery calcium documents the presence of coronary artery disease, the severity of this disease and any potential stenosis cannot be assessed on this non-gated CT examination. Assessment for potential risk factor modification, dietary therapy or pharmacologic therapy may be warranted, if clinically indicated. Electronically Signed   By: Corlis Leak M.D.   On: 02/15/2016 11:23   I have personally reviewed and evaluated these images and lab results as part of my medical decision-making.   EKG Interpretation   Date/Time:  Friday February 15 2016 07:23:27 EDT Ventricular Rate:  88 PR Interval:  152 QRS Duration: 89 QT Interval:  396 QTC Calculation: 479 R Axis:   89 Text Interpretation:  Sinus rhythm Borderline right axis deviation RSR' in  V1 or V2, probably normal variant Consider anterior infarct ST elevation,  consider inferior injury Artifact in lead(s) V4 Confirmed by Melanie Pellot   MD, Brodey Bonn ([id number]) on 02/15/2016 7:29:45 AM      MDM   Final diagnoses:  COPD exacerbation (HCC)  Hypoxia    Patient with several month history of fatigue weakness shortness of breath weight loss not able to be very functional. Patient with a history of heavy smoking. Patient does not use oxygen at home. On room air patient sats dropped down to 84%. Chest x-rays negative for pneumonia but consistent with COPD. The patient has undiagnosed COPD exacerbation probably needs home O2 as well. No evidence of pneumonia.    Vanetta Mulders, MD 02/15/16 1148

## 2016-02-16 DIAGNOSIS — E43 Unspecified severe protein-calorie malnutrition: Secondary | ICD-10-CM

## 2016-02-16 DIAGNOSIS — Z23 Encounter for immunization: Secondary | ICD-10-CM | POA: Diagnosis not present

## 2016-02-16 DIAGNOSIS — J439 Emphysema, unspecified: Secondary | ICD-10-CM

## 2016-02-16 DIAGNOSIS — E785 Hyperlipidemia, unspecified: Secondary | ICD-10-CM | POA: Diagnosis present

## 2016-02-16 DIAGNOSIS — Z681 Body mass index (BMI) 19 or less, adult: Secondary | ICD-10-CM | POA: Diagnosis not present

## 2016-02-16 DIAGNOSIS — F17200 Nicotine dependence, unspecified, uncomplicated: Secondary | ICD-10-CM | POA: Diagnosis present

## 2016-02-16 DIAGNOSIS — R0902 Hypoxemia: Secondary | ICD-10-CM | POA: Diagnosis present

## 2016-02-16 DIAGNOSIS — J9601 Acute respiratory failure with hypoxia: Secondary | ICD-10-CM | POA: Diagnosis present

## 2016-02-16 DIAGNOSIS — D696 Thrombocytopenia, unspecified: Secondary | ICD-10-CM | POA: Diagnosis present

## 2016-02-16 DIAGNOSIS — I10 Essential (primary) hypertension: Secondary | ICD-10-CM | POA: Diagnosis present

## 2016-02-16 DIAGNOSIS — J441 Chronic obstructive pulmonary disease with (acute) exacerbation: Secondary | ICD-10-CM | POA: Diagnosis not present

## 2016-02-16 DIAGNOSIS — I2510 Atherosclerotic heart disease of native coronary artery without angina pectoris: Secondary | ICD-10-CM | POA: Diagnosis present

## 2016-02-16 LAB — COMPREHENSIVE METABOLIC PANEL
ALBUMIN: 3 g/dL — AB (ref 3.5–5.0)
ALT: 13 U/L — AB (ref 17–63)
AST: 19 U/L (ref 15–41)
Alkaline Phosphatase: 50 U/L (ref 38–126)
Anion gap: 10 (ref 5–15)
BUN: 14 mg/dL (ref 6–20)
CHLORIDE: 96 mmol/L — AB (ref 101–111)
CO2: 31 mmol/L (ref 22–32)
CREATININE: 0.91 mg/dL (ref 0.61–1.24)
Calcium: 8.5 mg/dL — ABNORMAL LOW (ref 8.9–10.3)
GFR calc Af Amer: >60 mL/min (ref >60)
GFR calc non Af Amer: >60 mL/min (ref >60)
GLUCOSE: 125 mg/dL — AB (ref 65–99)
POTASSIUM: 4.9 mmol/L (ref 3.5–5.1)
SODIUM: 137 mmol/L (ref 135–145)
TOTAL PROTEIN: 6.2 g/dL — AB (ref 6.5–8.1)
Total Bilirubin: 0.5 mg/dL (ref 0.3–1.2)

## 2016-02-16 LAB — CBC
HCT: 44.9 % (ref 39.0–52.0)
Hemoglobin: 13.7 g/dL (ref 13.0–17.0)
MCH: 24.5 pg — ABNORMAL LOW (ref 26.0–34.0)
MCHC: 30.5 g/dL (ref 30.0–36.0)
MCV: 80.3 fL (ref 78.0–100.0)
Platelets: 149 10*3/uL — ABNORMAL LOW (ref 150–400)
RBC: 5.59 MIL/uL (ref 4.22–5.81)
RDW: 14 % (ref 11.5–15.5)
WBC: 5.5 10*3/uL (ref 4.0–10.5)

## 2016-02-16 MED ORDER — ALBUTEROL SULFATE (2.5 MG/3ML) 0.083% IN NEBU: 3.0000 mL | INHALATION_SOLUTION | Freq: Four times a day (QID) | RESPIRATORY_TRACT | Status: DC | PRN

## 2016-02-16 MED ORDER — CETYLPYRIDINIUM CHLORIDE 0.05 % MT LIQD
7.0000 mL | Freq: Two times a day (BID) | OROMUCOSAL | Status: DC
  Administered 2016-02-16 – 2016-02-19 (×8): 7 mL via OROMUCOSAL

## 2016-02-16 NOTE — Evaluation (Signed)
Physical Therapy Evaluation Patient Details Name: [name] MRN: [id number] DOB: 11/22/1950 Today's Date: 02/16/2016   History of Present Illness  Pt admitted with progressive SOB. Dx with PNA, COPD. PMH: CAD, long term smoker, ETOH use.   Clinical Impression  Patient presents with dyspnea on exertion and decreased endurance impacting mobility. Sp02 82% on RA at rest. Dropped to mid 80s on 1L/min 02. Able to maintain saturation in high 80s-90s on 2L/min 02. Education on pursed lip breathing. Encouraged ambulation multiple times per day to improve ventilation. Will follow acutely to improve endurance and overall mobility so pt can maximize independence.     Follow Up Recommendations No PT follow up;Supervision - Intermittent    Equipment Recommendations  None recommended by PT    Recommendations for Other Services       Precautions / Restrictions Precautions Precautions: None Restrictions Weight Bearing Restrictions: No      Mobility  Bed Mobility Overal bed mobility: Independent                Transfers Overall transfer level: Independent Equipment used: None             General transfer comment: Stood from EOB, no assist. No dizziness. SP02 82% on RA.   Ambulation/Gait Ambulation/Gait assistance: Modified independent (Device/Increase time) Ambulation Distance (Feet): 300 Feet Assistive device: None Gait Pattern/deviations: WFL(Within Functional Limits)   Gait velocity interpretation: at or above normal speed for age/gender General Gait Details: Steady gait. 3/4 DOE. SP02 dropped to low 80s on 1L/min 02. Sp02 dropped to 87% on 2L/min 02. Cues for pursed lip breathing. 2 standing rest breaks.  Stairs            Wheelchair Mobility    Modified Rankin (Stroke Patients Only)       Balance Overall balance assessment: No apparent balance deficits (not formally assessed)                                           Pertinent  Vitals/Pain Pain Assessment: No/denies pain    Home Living Family/patient expects to be discharged to:: Private residence Living Arrangements: Other relatives (nephew) Available Help at Discharge: Family;Available PRN/intermittently (nephew works 6 days/week) Type of Home: Apartment Home Access: Level entry     Home Layout: One level Home Equipment: None      Prior Function Level of Independence: Independent         Comments: Pt reports having to take many rest breaks during when walking and performing ADL due to fatigue and shortness of breath.     Hand Dominance   Dominant Hand: Right    Extremity/Trunk Assessment   Upper Extremity Assessment: Defer to OT evaluation           Lower Extremity Assessment: Overall WFL for tasks assessed      Cervical / Trunk Assessment: Normal  Communication   Communication: No difficulties  Cognition Arousal/Alertness: Awake/alert Behavior During Therapy: WFL for tasks assessed/performed Overall Cognitive Status: Within Functional Limits for tasks assessed                      General Comments      Exercises        Assessment/Plan    PT Assessment Patient needs continued PT services  PT Diagnosis Difficulty walking   PT Problem List Cardiopulmonary status limiting activity;Decreased activity tolerance  PT Treatment Interventions Gait training;Therapeutic exercise;Patient/family education;Balance training;Therapeutic activities   PT Goals (Current goals can be found in the Care Plan section) Acute Rehab PT Goals Patient Stated Goal: breathe better PT Goal Formulation: With patient Time For Goal Achievement: 03/01/16 Potential to Achieve Goals: Good    Frequency Min 3X/week   Barriers to discharge Decreased caregiver support      Co-evaluation               End of Session Equipment Utilized During Treatment: Gait belt;Oxygen Activity Tolerance: Treatment limited secondary to medical  complications (Comment) (drop in Sp02) Patient left: in chair;with call bell/phone within reach Nurse Communication: Mobility status         Time: 4132-4401 PT Time Calculation (min) (ACUTE ONLY): 18 min   Charges:   PT Evaluation $PT Eval Low Complexity: 1 Procedure     PT G Codes:        [name] 02/16/2016, 3:39 PM  Mylo Red, PT, DPT [phone]

## 2016-02-16 NOTE — Progress Notes (Signed)
Pt states he use to take Symbicort inhaler at home ordered by his old doctor Asencion Partridge([name] on [address]. Will continue to monitor.

## 2016-02-16 NOTE — Progress Notes (Signed)
[facility] PROGRESS NOTE  [name] BJY:[id number] DOB: [birth year] DOA: 02/15/2016 PCP: No PCP Per Patient  Assessment/Plan: Active Problems:    COPD (chronic obstructive pulmonary disease) (HCC) with exacerbation - Continue current Solu-Medrol dose - Place order for albuterol - Recommended nicotine cessation    Hyperlipemia -Stable continue aspirin    CAD (coronary artery disease) -Stable no chest pain reported continue aspirin     Protein-calorie malnutrition, severe -Dietitian recommended to ensure which patient refused. Encourage by mouth intake  Code Status: Full Family Communication: Discussed with family member at bedside Disposition Plan: Pending improvement in respiratory condition   Consultants:  None  Procedures:  None  Antibiotics:  None  HPI/Subjective: Pt has no new complaints. No acute issues reported overnight.  Objective: Filed Vitals:   02/16/16 0509 02/16/16 1345  BP: 131/78 121/76  Pulse: 81 102  Temp: 98 F (36.7 C) 97.6 F (36.4 C)  Resp: 24 17    Intake/Output Summary (Last 24 hours) at 02/16/16 1624 Last data filed at 02/16/16 1349  Gross per 24 hour  Intake 1623.33 ml  Output    438 ml  Net 1185.33 ml   Filed Weights   02/15/16 0656 02/15/16 1447 02/16/16 0512  Weight: 58.968 kg (130 lb) 47.673 kg (105 lb 1.6 oz) 49.397 kg (108 lb 14.4 oz)    Exam:   General:  Pt in nad, alert and awake  Cardiovascular: rrr, no rubs  Respiratory: prolonged exp phase, equal chest rise, no rhales  Abdomen: soft, nt, nd  Musculoskeletal: no cyanosis or clubbing   Data Reviewed: Basic Metabolic Panel:  Recent Labs Lab 02/15/16 0708 02/16/16 0641  NA 136 137  K 4.4 4.9  CL 91* 96*  CO2 35* 31  GLUCOSE 130* 125*  BUN 8 14  CREATININE 0.93 0.91  CALCIUM 8.9 8.5*   Liver Function Tests:  Recent Labs Lab 02/15/16 0851 02/16/16 0641  AST 27 19  ALT 15* 13*  ALKPHOS 56 50  BILITOT 0.8 0.5  PROT 7.0 6.2*   ALBUMIN 3.7 3.0*    Recent Labs Lab 02/15/16 0851  LIPASE 20   No results for input(s): AMMONIA in the last 168 hours. CBC:  Recent Labs Lab 02/15/16 0708 02/16/16 0641  WBC 5.5 5.5  HGB 14.2 13.7  HCT 46.3 44.9  MCV 80.8 80.3  PLT 140* 149*   Cardiac Enzymes: No results for input(s): CKTOTAL, CKMB, CKMBINDEX, TROPONINI in the last 168 hours. BNP (last 3 results)  Recent Labs  02/15/16 0708  BNP 181.2*    ProBNP (last 3 results) No results for input(s): PROBNP in the last 8760 hours.  CBG: No results for input(s): GLUCAP in the last 168 hours.  No results found for this or any previous visit (from the past 240 hour(s)).   Studies: Dg Chest 2 View  02/15/2016  CLINICAL DATA:  65 year old male with acute shortness of Breath. Initial encounter. EXAM: CHEST  2 VIEW COMPARISON:  07/22/2005. FINDINGS: Normal cardiac and mediastinal contours aside from prominence of the ascending aortic contour. Visualized tracheal air column is within normal limits. Hyperinflation today. No pneumothorax or pulmonary edema. No pleural effusion or confluent pulmonary opacity. No acute osseous abnormality identified. IMPRESSION: 1. Hyperinflation suggesting emphysema, new since 2006. No superimposed acute pulmonary process identified. 2. Questionable chronic enlargement of the ascending aorta. Other mediastinal contours are normal. Electronically Signed   By: Odessa Fleming M.D.   On: 02/15/2016 07:47   Ct Head Wo Contrast  02/15/2016  CLINICAL DATA:  65 year old male with shortness of breath and weakness for 1 month, progressive. Initial encounter. EXAM: CT HEAD WITHOUT CONTRAST TECHNIQUE: Contiguous axial images were obtained from the base of the skull through the vertex without intravenous contrast. COMPARISON:  None. FINDINGS: Minor paranasal sinus mucosal thickening. Tympanic cavities and mastoids are clear. No osseous abnormality identified. Visualized orbits and scalp soft tissues are within normal  limits. Fairly extensive Calcified atherosclerosis at the skull base. Small chronic lacunar infarcts in the inferior left cerebellum, left PICA territory. Slightly asymmetric volume loss along the left sylvian fissure, no definite supratentorial cortical encephalomalacia. No ventriculomegaly. No midline shift, mass effect, or evidence of intracranial mass lesion. No acute intracranial hemorrhage identified. No cortically based acute infarct identified. No suspicious intracranial vascular hyperdensity. 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 No acute intracranial abnormality identified. Electronically Signed   By: Odessa FlemingH  Hall M.D.   On: 02/15/2016 09:33   Ct Angio Chest Pe W/cm &/or Wo Cm  02/15/2016  CLINICAL DATA:  64 YOM. Presents with SOB with weakness for the past month, worsening recently. Hx CAD. Isovue 370 80ml EXAM: CT ANGIOGRAPHY CHEST WITH CONTRAST TECHNIQUE: Multidetector CT imaging of the chest was performed using the standard protocol during bolus administration of intravenous contrast. Multiplanar CT image reconstructions and MIPs were obtained to evaluate the vascular anatomy. CONTRAST:  80 mL Isovue 370 IV COMPARISON:  None available FINDINGS: Vascular: Left arm IV contrast injection. Innominate vein and SVC patent. Mild right atrial enlargement with reflux of contrast into hepatic veins. Prominent right ventricle. Borderline dilatation of central pulmonary arteries. Satisfactory opacification of pulmonary arteries noted, and there is no evidence of pulmonary emboli. Patent bilateral pulmonary veins drain to the left atrium. Moderate scattered coronary calcifications. Incomplete opacification of the thoracic aorta due to scan timing. Ascending segment measures 4.1 cm maximum transverse diameter, proximal arch 3.3 cm, distal arch/ proximal descending 3 cm. No suggestion of dissection. Classic 3 vessel brachiocephalic arterial origin anatomy. Scattered calcified plaque in the  arch and descending, and suprarenal abdominal segments. Mediastinum/Lymph Nodes: No masses or pathologically enlarged lymph nodes identified. No pericardial effusion. Lungs/Pleura: Advanced pulmonary emphysematous changes most marked in the upper lobes. Small calcified granuloma, anterior basal segment left lower lobe image 125/5. No nodule or focal infiltrate. Upper abdomen: No acute findings. Musculoskeletal: No chest wall mass or suspicious bone lesions identified. Spondylitic changes in the visualized upper lumbar spine. Review of the MIP images confirms the above findings. IMPRESSION: 1. Negative for acute PE. No suggestion of thoracic aortic dissection. 2. Dilated ascending thoracic aorta and arch. Recommend annual imaging followup by CTA or MRA. This recommendation follows 2010 ACCF/AHA/AATS/ACR/ASA/SCA/SCAI/SIR/STS/SVM Guidelines for the Diagnosis and Management of Patients with Thoracic Aortic Disease. Circulation. 2010; 121: [id number] 3. Advanced pulmonary emphysema. 4. Atherosclerosis, including aortic and coronary artery disease. Please note that although the presence of coronary artery calcium documents the presence of coronary artery disease, the severity of this disease and any potential stenosis cannot be assessed on this non-gated CT examination. Assessment for potential risk factor modification, dietary therapy or pharmacologic therapy may be warranted, if clinically indicated. Electronically Signed   By: Corlis Leak  Hassell M.D.   On: 02/15/2016 11:23    Scheduled Meds: . antiseptic oral rinse  7 mL Mouth Rinse BID  . aspirin EC  81 mg Oral Daily  . enoxaparin (LOVENOX) injection  40 mg Subcutaneous Q24H  . guaiFENesin  600 mg Oral BID  . methylPREDNISolone (SOLU-MEDROL) injection  40  mg Intravenous Q6H  . nicotine  21 mg Transdermal Daily  . sodium chloride flush  3 mL Intravenous Q12H   Continuous Infusions: . sodium chloride 75 mL/hr at 02/16/16 0003  . sodium chloride Stopped (02/16/16  0005)    Time spent: > 35 minutes   Penny Pia  [facility] Pager [phone]. If 7PM-7AM, please contact night-coverage at [email or web address], password [facility] 02/16/2016, 4:24 PM

## 2016-02-16 NOTE — Evaluation (Addendum)
Occupational Therapy Evaluation and Discharge Patient Details Name: Leonard PennerYwen Hardy MRN: [id number] DOB: 03/09/1951 Today's Date: 02/16/2016    History of Present Illness Pt admitted with SOB. Dx with PNA, COPD. PMH: CAD, long term smoker.   Clinical Impression   Pt performing overall at a modified independent level in ADL due to need to take frequent rest breaks. Pt with 02 sats down to 82% on RA, required 2L 02 to maintain sats above 90 during activity. Pt employing energy conservation strategies. No further OT needs.    Follow Up Recommendations  No OT follow up    Equipment Recommendations  None recommended by OT    Recommendations for Other Services       Precautions / Restrictions Precautions Precautions: None      Mobility Bed Mobility Overal bed mobility: Independent                Transfers Overall transfer level: Independent                    Balance                                            ADL Overall ADL's : Modified independent                                       General ADL Comments: Pt with 02 sats down to 82% on RA, requires 2L 02 to maintain sats above 90     Vision     Perception     Praxis      Pertinent Vitals/Pain Pain Assessment: No/denies pain     Hand Dominance Right   Extremity/Trunk Assessment Upper Extremity Assessment Upper Extremity Assessment: Overall WFL for tasks assessed   Lower Extremity Assessment Lower Extremity Assessment: Overall WFL for tasks assessed   Cervical / Trunk Assessment Cervical / Trunk Assessment: Normal   Communication Communication Communication: No difficulties   Cognition Arousal/Alertness: Awake/alert Behavior During Therapy: WFL for tasks assessed/performed Overall Cognitive Status: Within Functional Limits for tasks assessed                     General Comments       Exercises       Shoulder Instructions      Home  Living Family/patient expects to be discharged to:: Private residence Living Arrangements: Other relatives (nephew) Available Help at Discharge: Family;Available PRN/intermittently (nephew works 6 days a week) Type of Home: Apartment Home Access: Level entry     Home Layout: One level     Bathroom Shower/Tub: Chief Strategy OfficerTub/shower unit   Bathroom Toilet: Standard     Home Equipment: None          Prior Functioning/Environment Level of Independence: Independent        Comments: Pt reports having to take many rest breaks during when walking and performing ADL due to fatigue and shortness of breath.    OT Diagnosis: Generalized weakness   OT Problem List:     OT Treatment/Interventions:      OT Goals(Current goals can be found in the care plan section) Acute Rehab OT Goals Patient Stated Goal: breathe better  OT Frequency:     Barriers to D/C:  Co-evaluation              End of Session Equipment Utilized During Treatment: Oxygen Nurse Communication: Mobility status (02 sats with ambulation)  Activity Tolerance: Patient tolerated treatment well Patient left: in chair;with call bell/phone within reach   Time: 1415-1430 OT Time Calculation (min): 15 min Charges:  OT General Charges $OT Visit: 1 Procedure OT Evaluation $OT Eval Moderate Complexity: 1 Procedure G-Codes: OT G-codes **NOT FOR INPATIENT CLASS** Functional Assessment Tool Used: clinical judgement Functional Limitation: Self care Self Care Current Status (Z6109): At least 1 percent but less than 20 percent impaired, limited or restricted Self Care Goal Status (U0454): At least 1 percent but less than 20 percent impaired, limited or restricted Self Care Discharge Status [phone]): At least 1 percent but less than 20 percent impaired, limited or restricted  Evern Bio 02/16/2016, 3:04 PM [phone]

## 2016-02-17 DIAGNOSIS — J441 Chronic obstructive pulmonary disease with (acute) exacerbation: Principal | ICD-10-CM

## 2016-02-17 MED ORDER — METHYLPREDNISOLONE SODIUM SUCC 40 MG IJ SOLR
40.0000 mg | Freq: Two times a day (BID) | INTRAMUSCULAR | Status: DC
  Administered 2016-02-17 – 2016-02-19 (×4): 40 mg via INTRAVENOUS
  Filled 2016-02-17 (×4): qty 1

## 2016-02-17 NOTE — Progress Notes (Signed)
[facility] PROGRESS NOTE  Lysbeth PennerYwen Myers ZOX:[id number] DOB: 12/14/1950 DOA: 02/15/2016 PCP: No PCP Per Patient  Assessment/Plan: Active Problems:    COPD (chronic obstructive pulmonary disease) (HCC) with exacerbation - Continue current Solu-Medrol dose (will decrease dose) - Placed order for albuterol - Recommended nicotine cessation again    Hyperlipemia -Stable continue aspirin    CAD (coronary artery disease) -Stable no chest pain reported continue aspirin    Protein-calorie malnutrition, severe -Dietitian recommended to ensure which patient refused. Encourage by mouth intake  Code Status: Full Family Communication: Discussed with family member at bedside Disposition Plan: Pending improvement in respiratory condition   Consultants:  None  Procedures:  None  Antibiotics:  None  HPI/Subjective: Pt has no new complaints. No acute issues reported overnight. States he is sob with activity  Objective: Filed Vitals:   02/17/16 0441 02/17/16 1401  BP: 129/83 135/76  Pulse: 93 102  Temp: 97.5 F (36.4 C) 97.7 F (36.5 C)  Resp: 16 17    Intake/Output Summary (Last 24 hours) at 02/17/16 1630 Last data filed at 02/17/16 0949  Gross per 24 hour  Intake 1868.75 ml  Output    500 ml  Net 1368.75 ml   Filed Weights   02/15/16 1447 02/16/16 0512 02/17/16 0616  Weight: 47.673 kg (105 lb 1.6 oz) 49.397 kg (108 lb 14.4 oz) 49.442 kg (109 lb)    Exam:   General:  Pt in nad, alert and awake  Cardiovascular: rrr, no rubs  Respiratory: prolonged exp phase, equal chest rise, no rhales  Abdomen: soft, nt, nd  Musculoskeletal: no cyanosis or clubbing   Data Reviewed: Basic Metabolic Panel:  Recent Labs Lab 02/15/16 0708 02/16/16 0641  NA 136 137  K 4.4 4.9  CL 91* 96*  CO2 35* 31  GLUCOSE 130* 125*  BUN 8 14  CREATININE 0.93 0.91  CALCIUM 8.9 8.5*   Liver Function Tests:  Recent Labs Lab 02/15/16 0851 02/16/16 0641  AST 27 19  ALT 15*  13*  ALKPHOS 56 50  BILITOT 0.8 0.5  PROT 7.0 6.2*  ALBUMIN 3.7 3.0*    Recent Labs Lab 02/15/16 0851  LIPASE 20   No results for input(s): AMMONIA in the last 168 hours. CBC:  Recent Labs Lab 02/15/16 0708 02/16/16 0641  WBC 5.5 5.5  HGB 14.2 13.7  HCT 46.3 44.9  MCV 80.8 80.3  PLT 140* 149*   Cardiac Enzymes: No results for input(s): CKTOTAL, CKMB, CKMBINDEX, TROPONINI in the last 168 hours. BNP (last 3 results)  Recent Labs  02/15/16 0708  BNP 181.2*    ProBNP (last 3 results) No results for input(s): PROBNP in the last 8760 hours.  CBG: No results for input(s): GLUCAP in the last 168 hours.  No results found for this or any previous visit (from the past 240 hour(s)).   Studies: No results found.  Scheduled Meds: . antiseptic oral rinse  7 mL Mouth Rinse BID  . aspirin EC  81 mg Oral Daily  . enoxaparin (LOVENOX) injection  40 mg Subcutaneous Q24H  . guaiFENesin  600 mg Oral BID  . [START ON 02/18/2016] methylPREDNISolone (SOLU-MEDROL) injection  40 mg Intravenous Q12H  . nicotine  21 mg Transdermal Daily  . sodium chloride flush  3 mL Intravenous Q12H   Continuous Infusions:    Time spent: > 35 minutes   Penny PiaVEGA, Alix Lahmann  [facility] Pager [phone]. If 7PM-7AM, please contact night-coverage at [email or web address], password [email or web address] 02/17/2016, 4:30 PM  LOS:  1 day

## 2016-02-18 NOTE — Progress Notes (Signed)
Physical Therapy Treatment Patient Details Name: [name] MRN: [id number] DOB: 12/16/1950 Today's Date: 02/18/2016    History of Present Illness Pt admitted with progressive SOB. Dx with PNA, COPD. PMH: CAD, long term smoker, ETOH use.     PT Comments    Pt ambulating well regarding stability and speed but activity is limited by dropping SpO2. At rest the patient was 95% on 2L but dropping to 79% with ambulation on 2L. Levels returning to above 90% when placed on 3L. Nursing notified. Pt states that he is planning to go home with family support when discharged.   Follow Up Recommendations  No PT follow up;Supervision - Intermittent     Equipment Recommendations  None recommended by PT    Recommendations for Other Services       Precautions / Restrictions Precautions Precautions: None Restrictions Weight Bearing Restrictions: No    Mobility  Bed Mobility Overal bed mobility: Independent                Transfers Overall transfer level: Independent Equipment used: None                Ambulation/Gait Ambulation/Gait assistance: Modified independent (Device/Increase time) Ambulation Distance (Feet): 400 Feet Assistive device: None Gait Pattern/deviations: WFL(Within Functional Limits)   Gait velocity interpretation: at or above normal speed for age/gender General Gait Details: Steady pattern, no loss of balance. Using 2L O2 during ambulation, dropping to 79%. O2 increased to 3L with return to 90s. Nursing notified.    Stairs            Wheelchair Mobility    Modified Rankin (Stroke Patients Only)       Balance Overall balance assessment: No apparent balance deficits (not formally assessed)                                  Cognition Arousal/Alertness: Awake/alert Behavior During Therapy: WFL for tasks assessed/performed Overall Cognitive Status: Within Functional Limits for tasks assessed                       Exercises      General Comments        Pertinent Vitals/Pain Pain Assessment: No/denies pain    Home Living                      Prior Function            PT Goals (current goals can now be found in the care plan section) Acute Rehab PT Goals Patient Stated Goal: be able to go home. PT Goal Formulation: With patient Time For Goal Achievement: 03/01/16 Potential to Achieve Goals: Good Progress towards PT goals: Progressing toward goals    Frequency  Min 3X/week    PT Plan Current plan remains appropriate    Co-evaluation             End of Session Equipment Utilized During Treatment: Gait belt;Oxygen Activity Tolerance: Treatment limited secondary to medical complications (Comment) (low SpO2. ) Patient left: in bed;with call bell/phone within reach     Time: 0908-0929 PT Time Calculation (min) (ACUTE ONLY): 21 min  Charges:  $Gait Training: 8-22 mins                    G Codes:      [name], PT, CSCS Pager [phone] Office [phone]  02/18/2016, 9:39 AM

## 2016-02-18 NOTE — Progress Notes (Signed)
[facility] PROGRESS NOTE  [name] ZOX:[id number] DOB: 1951/03/03 DOA: 02/15/2016 PCP: No PCP Per Patient  Assessment/Plan: Active Problems:    COPD (chronic obstructive pulmonary disease) (HCC) with exacerbation - Continue current Solu-Medrol dose (will decrease dose) - Placed order for albuterol - Recommended nicotine cessation again - will wean to room air as tolerated    Hyperlipemia -Stable continue aspirin    CAD (coronary artery disease) -Stable no chest pain reported continue aspirin    Protein-calorie malnutrition, severe -Dietitian recommended to ensure which patient refused. Encourage by mouth intake  Code Status: Full Family Communication: Discussed with family member at bedside Disposition Plan: Pending improvement in respiratory condition   Consultants:  None  Procedures:  None  Antibiotics:  None  HPI/Subjective: Patient reports shortness of breath is improved. He has been ambulating  Objective: Filed Vitals:   02/17/16 2208 02/18/16 0523  BP: 134/82 129/75  Pulse: 98 97  Temp: 97.8 F (36.6 C) 98.2 F (36.8 C)  Resp: 18 16    Intake/Output Summary (Last 24 hours) at 02/18/16 1503 Last data filed at 02/18/16 1053  Gross per 24 hour  Intake    240 ml  Output      0 ml  Net    240 ml   Filed Weights   02/16/16 0512 02/17/16 0616 02/18/16 0335  Weight: 49.397 kg (108 lb 14.4 oz) 49.442 kg (109 lb) 50.032 kg (110 lb 4.8 oz)    Exam:   General:  Pt in nad, alert and awake  Cardiovascular: rrr, no rubs  Respiratory: prolonged exp phase, equal chest rise, no rhales  Abdomen: soft, nt, nd  Musculoskeletal: no cyanosis or clubbing   Data Reviewed: Basic Metabolic Panel:  Recent Labs Lab 02/15/16 0708 02/16/16 0641  NA 136 137  K 4.4 4.9  CL 91* 96*  CO2 35* 31  GLUCOSE 130* 125*  BUN 8 14  CREATININE 0.93 0.91  CALCIUM 8.9 8.5*   Liver Function Tests:  Recent Labs Lab 02/15/16 0851 02/16/16 0641  AST 27  19  ALT 15* 13*  ALKPHOS 56 50  BILITOT 0.8 0.5  PROT 7.0 6.2*  ALBUMIN 3.7 3.0*    Recent Labs Lab 02/15/16 0851  LIPASE 20   No results for input(s): AMMONIA in the last 168 hours. CBC:  Recent Labs Lab 02/15/16 0708 02/16/16 0641  WBC 5.5 5.5  HGB 14.2 13.7  HCT 46.3 44.9  MCV 80.8 80.3  PLT 140* 149*   Cardiac Enzymes: No results for input(s): CKTOTAL, CKMB, CKMBINDEX, TROPONINI in the last 168 hours. BNP (last 3 results)  Recent Labs  02/15/16 0708  BNP 181.2*    ProBNP (last 3 results) No results for input(s): PROBNP in the last 8760 hours.  CBG: No results for input(s): GLUCAP in the last 168 hours.  No results found for this or any previous visit (from the past 240 hour(s)).   Studies: No results found.  Scheduled Meds: . antiseptic oral rinse  7 mL Mouth Rinse BID  . aspirin EC  81 mg Oral Daily  . enoxaparin (LOVENOX) injection  40 mg Subcutaneous Q24H  . guaiFENesin  600 mg Oral BID  . methylPREDNISolone (SOLU-MEDROL) injection  40 mg Intravenous Q12H  . nicotine  21 mg Transdermal Daily  . sodium chloride flush  3 mL Intravenous Q12H   Continuous Infusions:    Time spent: > 35 minutes   Penny Pia  [facility] Pager [phone]. If 7PM-7AM, please contact night-coverage at  [email or web address], password [email or web address] 02/18/2016, 3:03 PM  LOS: 2 days

## 2016-02-19 MED ORDER — ASPIRIN 81 MG PO TBEC: 81.0000 mg | DELAYED_RELEASE_TABLET | Freq: Every day | ORAL | Status: DC

## 2016-02-19 MED ORDER — ALBUTEROL SULFATE HFA 108 (90 BASE) MCG/ACT IN AERS: 2.0000 | INHALATION_SPRAY | Freq: Four times a day (QID) | RESPIRATORY_TRACT | Status: AC | PRN

## 2016-02-19 MED ORDER — AMLODIPINE BESYLATE 5 MG PO TABS: 5.0000 mg | ORAL_TABLET | Freq: Every day | ORAL | Status: DC

## 2016-02-19 MED ORDER — NICOTINE 21 MG/24HR TD PT24: 21.0000 mg | MEDICATED_PATCH | Freq: Every day | TRANSDERMAL | Status: DC

## 2016-02-19 NOTE — Progress Notes (Signed)
Jethro BolusYwen Flitton to be D/C'd to home per MD order.  Discussed with the patient and all questions fully answered.  VSS, Skin clean, dry and intact without evidence of skin break down, no evidence of skin tears noted. IV catheter discontinued intact. Site without signs and symptoms of complications. Dressing and pressure applied.  An After Visit Summary was printed and given to the patient. Patient received prescriptions.  D/c education completed with patient/family including follow up instructions, medication list, d/c activities limitations if indicated, with other d/c instructions as indicated by MD - patient able to verbalize understanding, all questions fully answered.   Patient instructed to return to ED, call 911, or call MD for any changes in condition.   Patient escorted via WC, and D/C home via private auto.  Theressa StampsKayla L Price 02/19/2016 7:44 PM

## 2016-02-19 NOTE — Progress Notes (Signed)
Nutrition Follow-up  DOCUMENTATION CODES:   Underweight, Severe malnutrition in context of chronic illness  INTERVENTION:   -Continue to monitor intake on regular diet, family members can bring in outside food to supplement intake  NUTRITION DIAGNOSIS:   Malnutrition related to chronic illness as evidenced by severe depletion of body fat, severe depletion of muscle mass.  Ongoing  GOAL:   Patient will meet greater than or equal to 90% of their needs  Progressing  MONITOR:   Supplement acceptance, PO intake, Labs, Weight trends, Skin, I & O's  REASON FOR ASSESSMENT:   Consult Assessment of nutrition requirement/status  ASSESSMENT:   [name] is a 65 y.o. [place] male, non English speaking, heavy smoker, with a history of remote PNA, Recent ETOH abuse, HLD, and h/o CAD s/p stent on 2006 presenting with 6 month history of progressive dyspnea to the point of becoming short of breath at rest. He denies any fever, chills, night sweats, or hemoptysis. He reports productive cough, mostly saliva. He admits to sick contacts, some of them may have had the flu. Reports significant weight loss with decreased appetite; denies any nausea or vomiting. He denies any chest pain or palpitations. No confusion is reported. He does has generalized weakness and occasional dizziness without vertigo, as he is becoming increasingly deconditioned. CT head is negative.   Pt and pt wife were not in room at time of RD visit.   Pt remains on a regular diet. Intake remains good; PO: 100%. Pt is also receiving outside food from family; noted a styrofroam container of take out food at bedside. Per MD, Ensure supplements were d/c on 02/15/16, due to pt refusal.   Labs reviewed.   Diet Order:  Diet regular Room service appropriate?: Yes; Fluid consistency:: Thin  Skin:  Reviewed, no issues  Last BM:  02/18/16  Height:   Ht Readings from Last 1 Encounters:  02/15/16 5\' 4"  (1.626 m)    Weight:    Wt Readings from Last 1 Encounters:  02/19/16 111 lb 14.4 oz (50.758 kg)    Ideal Body Weight:  59.1 kg  BMI:  Body mass index is 19.2 kg/(m^2).  Estimated Nutritional Needs:   Kcal:  1400-1600  Protein:  65-80 grams  Fluid:  1.4-1.6 L  EDUCATION NEEDS:   Education needs addressed  Joette Schmoker A. Mayford KnifeWilliams, RD, LDN, CDE Pager: [phone] After hours Pager: [phone]

## 2016-02-19 NOTE — Discharge Summary (Signed)
Physician Discharge Summary  [name] ZOX:[id number] DOB: 1951-07-24 DOA: 02/15/2016  PCP: No PCP Per Patient  Admit date: 02/15/2016 Discharge date: 02/19/2016  Time spent: > 35 minutes  Recommendations for Outpatient Follow-up:  1. Please obtain PFT's upon post hospital discharge   Discharge Diagnoses:  Active Problems:   Hyperlipemia   CAD (coronary artery disease)   COPD (chronic obstructive pulmonary disease) (HCC)   PNA (pneumonia)   COPD exacerbation (HCC)   Protein-calorie malnutrition, severe   Discharge Condition: stable  Diet recommendation: low sodium heart healthy diet  Filed Weights   02/17/16 0616 02/18/16 0335 02/19/16 0419  Weight: 49.442 kg (109 lb) 50.032 kg (110 lb 4.8 oz) 50.758 kg (111 lb 14.4 oz)    History of present illness:  65 year old [place] male with reported history of heavy smoking and history of remote pneumonia who presented to the hospital complaining of shortness of breath. Diagnosed with COPD exacerbation.  Hospital Course:  COPD exacerbation -Patient received roughly 5 days of steroid treatment with improvement in condition. No antibiotics were used and patient improved without antibiotics. -Recommend follow-up with primary care physician for further evaluation including PFTs as outpatient - We'll discharge with prescription for albuterol  Essential hypertension -Not well controlled as such will place patient on amlodipine on discharge  Procedures:  None  Consultations:  None  Discharge Exam: Filed Vitals:   02/19/16 0800 02/19/16 1433  BP: 142/85 173/88  Pulse: 91 100  Temp: 98.3 F (36.8 C) 98.1 F (36.7 C)  Resp: 17 20    General: Patient in no acute distress, alert and awake Cardiovascular: Regular rate and rhythm, no murmurs or rubs Respiratory: No increased work of breathing, no wheezes  Discharge Instructions   Discharge Instructions    Call MD for:  temperature >100.4    Complete by:  As directed       Diet - low sodium heart healthy    Complete by:  As directed      Discharge instructions    Complete by:  As directed   Please be sure to follow up with your primary care physician in 1-2 weeks or sooner should any new concerns arise.     Increase activity slowly    Complete by:  As directed           Current Discharge Medication List    START taking these medications   Details  albuterol (PROVENTIL HFA;VENTOLIN HFA) 108 (90 Base) MCG/ACT inhaler Inhale 2 puffs into the lungs every 6 (six) hours as needed for wheezing or shortness of breath. Qty: 1 Inhaler, Refills: 0    amLODipine (NORVASC) 5 MG tablet Take 1 tablet (5 mg total) by mouth daily. Qty: 30 tablet, Refills: 0    aspirin EC 81 MG EC tablet Take 1 tablet (81 mg total) by mouth daily. Qty: 30 tablet, Refills: 0    nicotine (NICODERM CQ - DOSED IN MG/24 HOURS) 21 mg/24hr patch Place 1 patch (21 mg total) onto the skin daily. Qty: 28 patch, Refills: 0       No Known Allergies    The results of significant diagnostics from this hospitalization (including imaging, microbiology, ancillary and laboratory) are listed below for reference.    Significant Diagnostic Studies: Dg Chest 2 View  02/15/2016  CLINICAL DATA:  65 year old male with acute shortness of Breath. Initial encounter. EXAM: CHEST  2 VIEW COMPARISON:  07/22/2005. FINDINGS: Normal cardiac and mediastinal contours aside from prominence of the ascending aortic contour. Visualized tracheal  air column is within normal limits. Hyperinflation today. No pneumothorax or pulmonary edema. No pleural effusion or confluent pulmonary opacity. No acute osseous abnormality identified. IMPRESSION: 1. Hyperinflation suggesting emphysema, new since 2006. No superimposed acute pulmonary process identified. 2. Questionable chronic enlargement of the ascending aorta. Other mediastinal contours are normal. Electronically Signed   By: Odessa FlemingH  Hall M.D.   On: 02/15/2016 07:47   Ct Head Wo  Contrast  02/15/2016  CLINICAL DATA:  65 year old male with shortness of breath and weakness for 1 month, progressive. Initial encounter. EXAM: CT HEAD WITHOUT CONTRAST TECHNIQUE: Contiguous axial images were obtained from the base of the skull through the vertex without intravenous contrast. COMPARISON:  None. FINDINGS: Minor paranasal sinus mucosal thickening. Tympanic cavities and mastoids are clear. No osseous abnormality identified. Visualized orbits and scalp soft tissues are within normal limits. Fairly extensive Calcified atherosclerosis at the skull base. Small chronic lacunar infarcts in the inferior left cerebellum, left PICA territory. Slightly asymmetric volume loss along the left sylvian fissure, no definite supratentorial cortical encephalomalacia. No ventriculomegaly. No midline shift, mass effect, or evidence of intracranial mass lesion. No acute intracranial hemorrhage identified. No cortically based acute infarct identified. No suspicious intracranial vascular hyperdensity. 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 No acute intracranial abnormality identified. Electronically Signed   By: Odessa FlemingH  Hall M.D.   On: 02/15/2016 09:33   Ct Angio Chest Pe W/cm &/or Wo Cm  02/15/2016  CLINICAL DATA:  64 YOM. Presents with SOB with weakness for the past month, worsening recently. Hx CAD. Isovue 370 80ml EXAM: CT ANGIOGRAPHY CHEST WITH CONTRAST TECHNIQUE: Multidetector CT imaging of the chest was performed using the standard protocol during bolus administration of intravenous contrast. Multiplanar CT image reconstructions and MIPs were obtained to evaluate the vascular anatomy. CONTRAST:  80 mL Isovue 370 IV COMPARISON:  None available FINDINGS: Vascular: Left arm IV contrast injection. Innominate vein and SVC patent. Mild right atrial enlargement with reflux of contrast into hepatic veins. Prominent right ventricle. Borderline dilatation of central pulmonary arteries. Satisfactory  opacification of pulmonary arteries noted, and there is no evidence of pulmonary emboli. Patent bilateral pulmonary veins drain to the left atrium. Moderate scattered coronary calcifications. Incomplete opacification of the thoracic aorta due to scan timing. Ascending segment measures 4.1 cm maximum transverse diameter, proximal arch 3.3 cm, distal arch/ proximal descending 3 cm. No suggestion of dissection. Classic 3 vessel brachiocephalic arterial origin anatomy. Scattered calcified plaque in the arch and descending, and suprarenal abdominal segments. Mediastinum/Lymph Nodes: No masses or pathologically enlarged lymph nodes identified. No pericardial effusion. Lungs/Pleura: Advanced pulmonary emphysematous changes most marked in the upper lobes. Small calcified granuloma, anterior basal segment left lower lobe image 125/5. No nodule or focal infiltrate. Upper abdomen: No acute findings. Musculoskeletal: No chest wall mass or suspicious bone lesions identified. Spondylitic changes in the visualized upper lumbar spine. Review of the MIP images confirms the above findings. IMPRESSION: 1. Negative for acute PE. No suggestion of thoracic aortic dissection. 2. Dilated ascending thoracic aorta and arch. Recommend annual imaging followup by CTA or MRA. This recommendation follows 2010 ACCF/AHA/AATS/ACR/ASA/SCA/SCAI/SIR/STS/SVM Guidelines for the Diagnosis and Management of Patients with Thoracic Aortic Disease. Circulation. 2010; 121: [id number] 3. Advanced pulmonary emphysema. 4. Atherosclerosis, including aortic and coronary artery disease. Please note that although the presence of coronary artery calcium documents the presence of coronary artery disease, the severity of this disease and any potential stenosis cannot be assessed on this non-gated CT examination. Assessment for potential risk factor  modification, dietary therapy or pharmacologic therapy may be warranted, if clinically indicated. Electronically Signed    By: Corlis Leak M.D.   On: 02/15/2016 11:23    Microbiology: No results found for this or any previous visit (from the past 240 hour(s)).   Labs: Basic Metabolic Panel:  Recent Labs Lab 02/15/16 0708 02/16/16 0641  NA 136 137  K 4.4 4.9  CL 91* 96*  CO2 35* 31  GLUCOSE 130* 125*  BUN 8 14  CREATININE 0.93 0.91  CALCIUM 8.9 8.5*   Liver Function Tests:  Recent Labs Lab 02/15/16 0851 02/16/16 0641  AST 27 19  ALT 15* 13*  ALKPHOS 56 50  BILITOT 0.8 0.5  PROT 7.0 6.2*  ALBUMIN 3.7 3.0*    Recent Labs Lab 02/15/16 0851  LIPASE 20   No results for input(s): AMMONIA in the last 168 hours. CBC:  Recent Labs Lab 02/15/16 0708 02/16/16 0641  WBC 5.5 5.5  HGB 14.2 13.7  HCT 46.3 44.9  MCV 80.8 80.3  PLT 140* 149*   Cardiac Enzymes: No results for input(s): CKTOTAL, CKMB, CKMBINDEX, TROPONINI in the last 168 hours. BNP: BNP (last 3 results)  Recent Labs  02/15/16 0708  BNP 181.2*    ProBNP (last 3 results) No results for input(s): PROBNP in the last 8760 hours.  CBG: No results for input(s): GLUCAP in the last 168 hours.   Signed:  Penny Pia MD.  [facility] 02/19/2016, 4:09 PM

## 2017-02-22 IMAGING — DX DG CHEST 2V
2 series · 2 of 2 positions shown · non-contrast
Comparison: 07/22/2005.

CLINICAL DATA: 64-year-old male with acute shortness of Breath.
Initial encounter.

EXAM:
CHEST  2 VIEW

[chest pa]
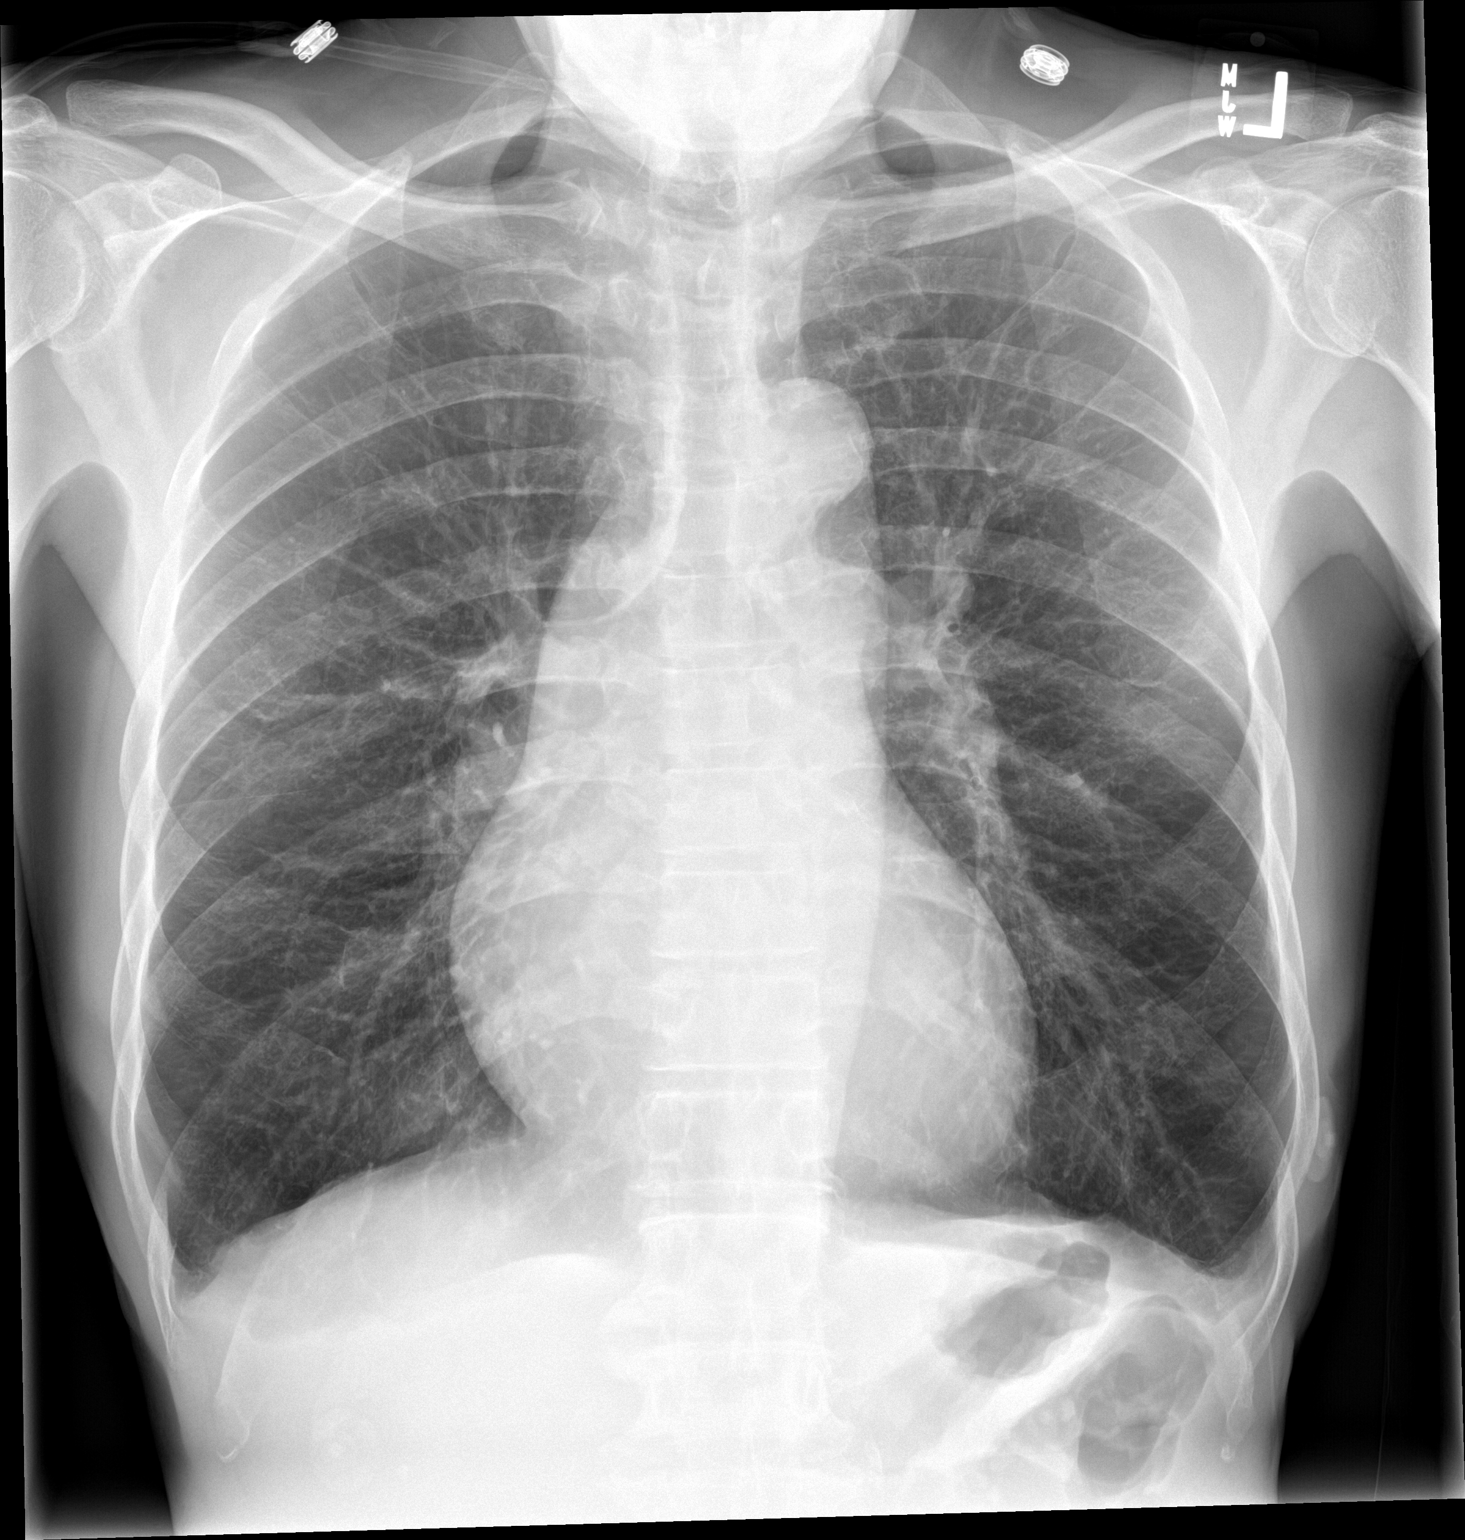

[chest lat]
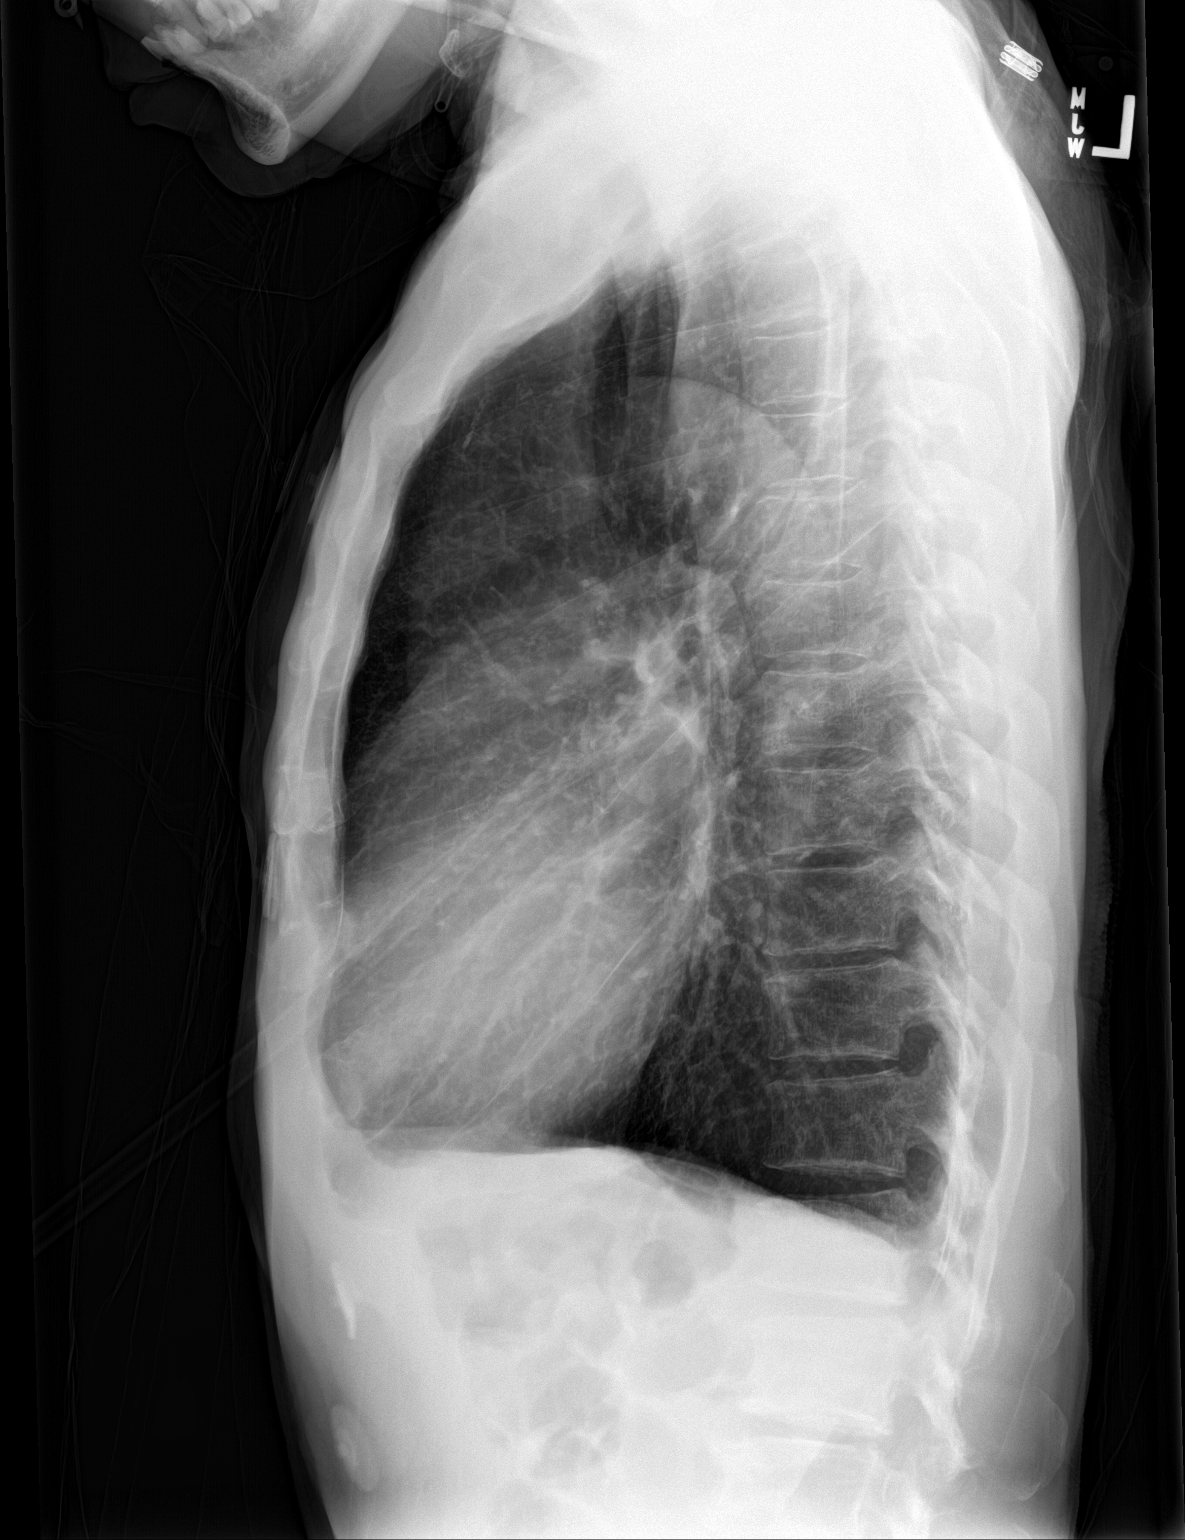

[2 of 2 positions shown; findings below may reference images not displayed]

FINDINGS: Normal cardiac and mediastinal contours aside from prominence of the
ascending aortic contour. Visualized tracheal air column is within
normal limits. Hyperinflation today. No pneumothorax or pulmonary
edema. No pleural effusion or confluent pulmonary opacity. No acute
osseous abnormality identified.
IMPRESSION: 1. Hyperinflation suggesting emphysema, new since 0225. No
superimposed acute pulmonary process identified.
2. Questionable chronic enlargement of the ascending aorta. Other
mediastinal contours are normal.

## 2017-02-22 IMAGING — CT CT HEAD W/O CM
2 series · 15 of 30 positions shown, 17 images · non-contrast
Comparison: None.

CLINICAL DATA: 64-year-old male with shortness of breath and
weakness for 1 month, progressive. Initial encounter.

EXAM:
CT HEAD WITHOUT CONTRAST
TECHNIQUE: Contiguous axial images were obtained from the base of the skull
through the vertex without intravenous contrast.

[Series 2: head without · axial · non-contrast · 0.44mm/px · z∈[-135,-20]mm · 7 of 31 slices shown, 9 images]
[im 4/31  brain]
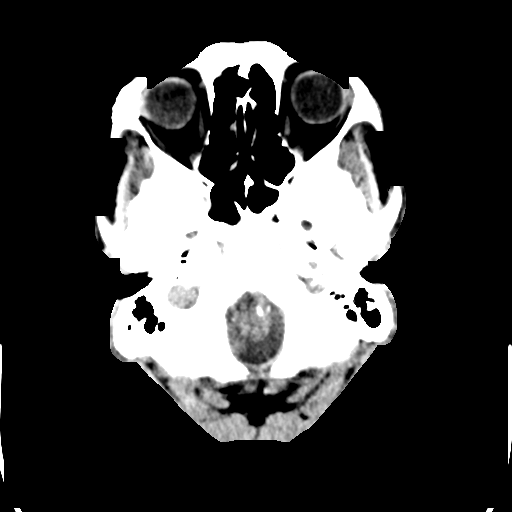
[im 4/31  bone]
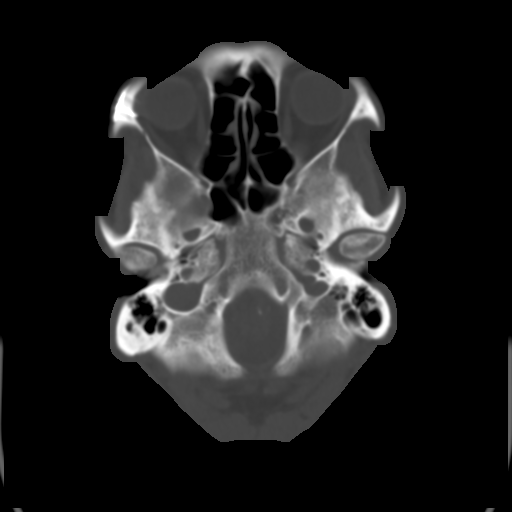
[im 8/31  brain]
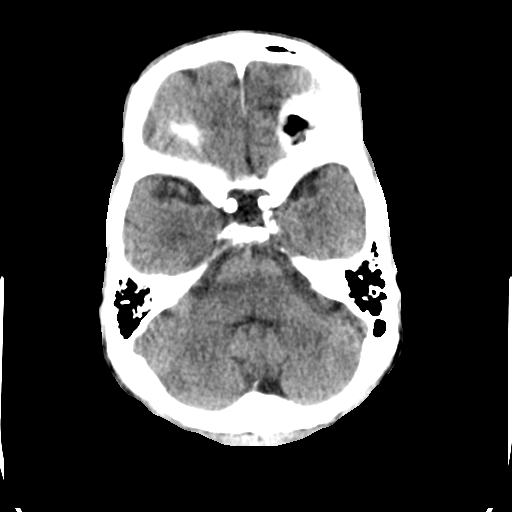
[im 12/31  brain]
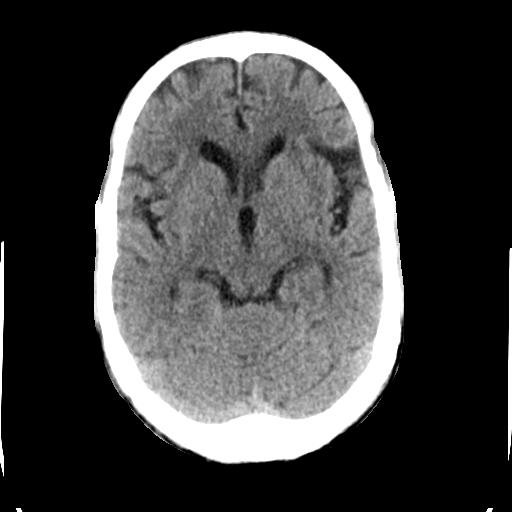
[im 16/31  brain]
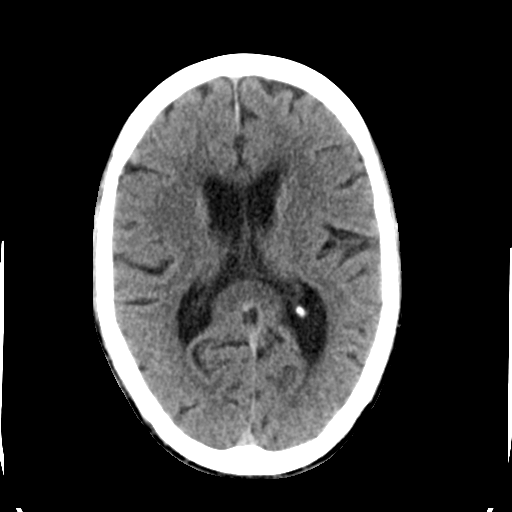
[im 19/31  brain]
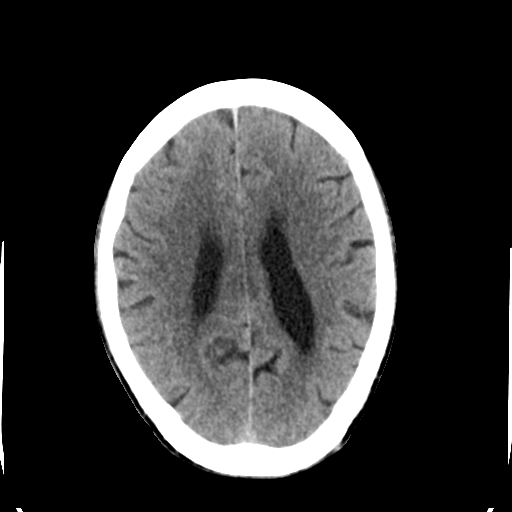
[im 19/31  bone]
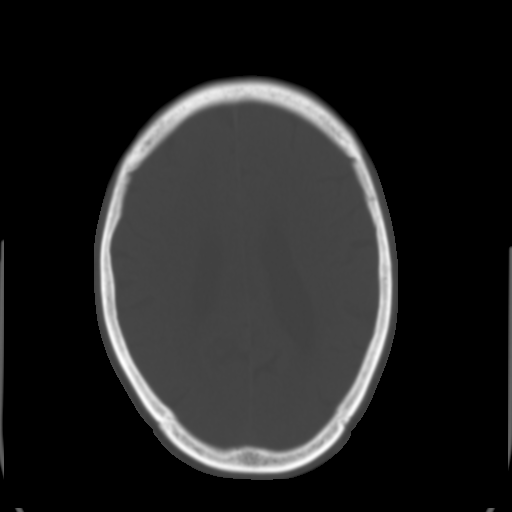
[im 23/31  brain]
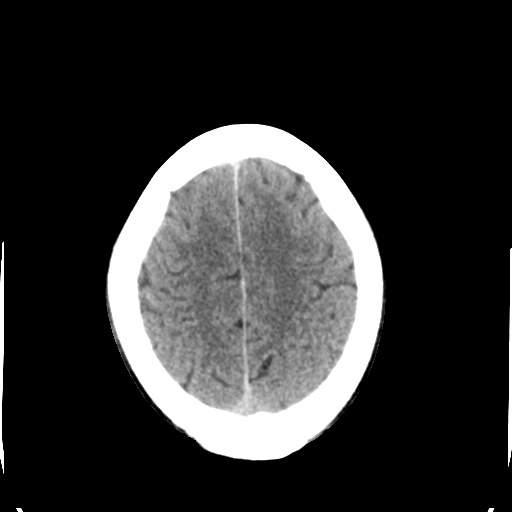
[im 27/31  brain]
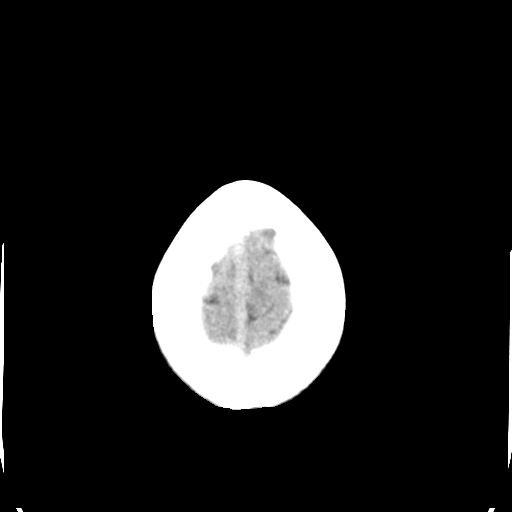

[Series 3: head bone · axial · 0.44mm/px · z∈[-136,-16]mm · 8 of 76 slices shown]
[im 8/76  bone]
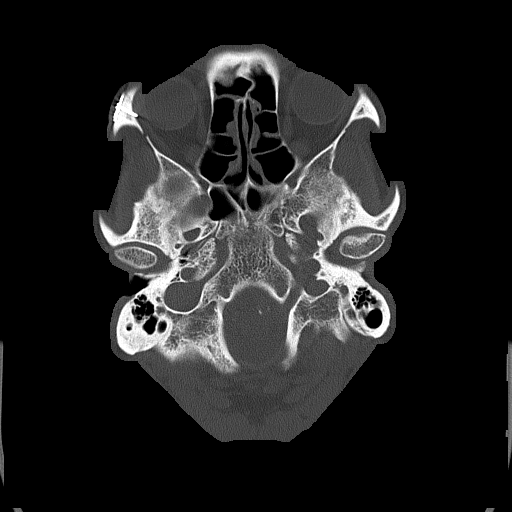
[im 16/76  bone]
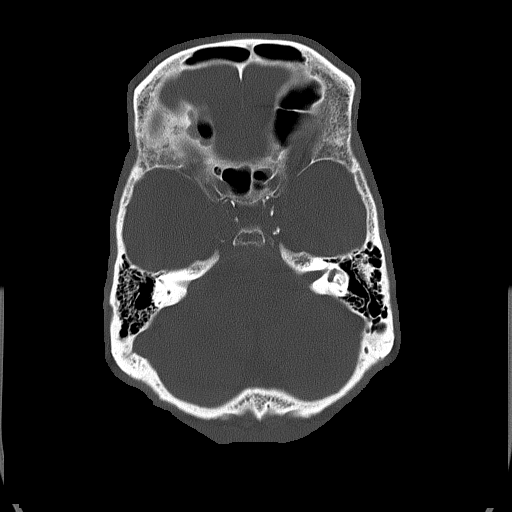
[im 23/76  bone]
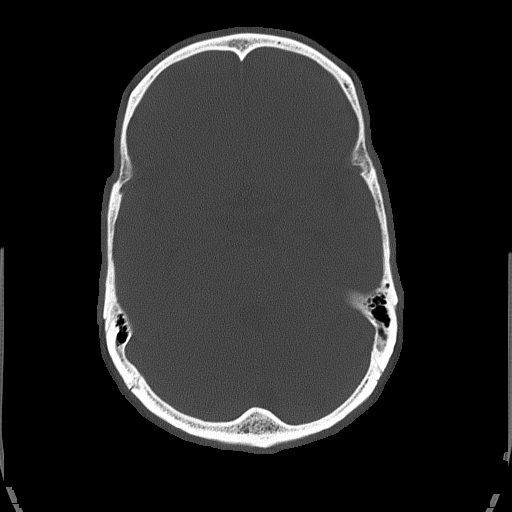
[im 34/76  bone]
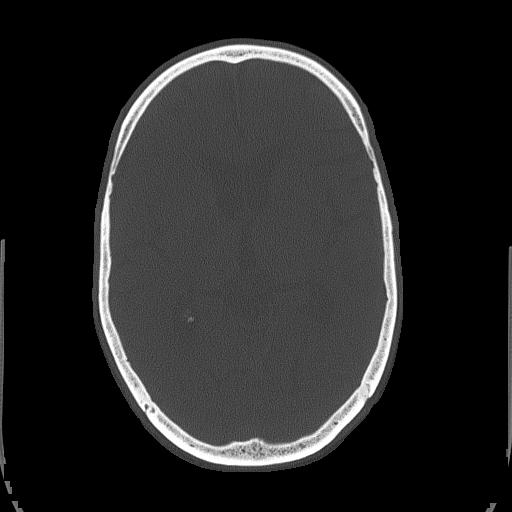
[im 42/76  bone]
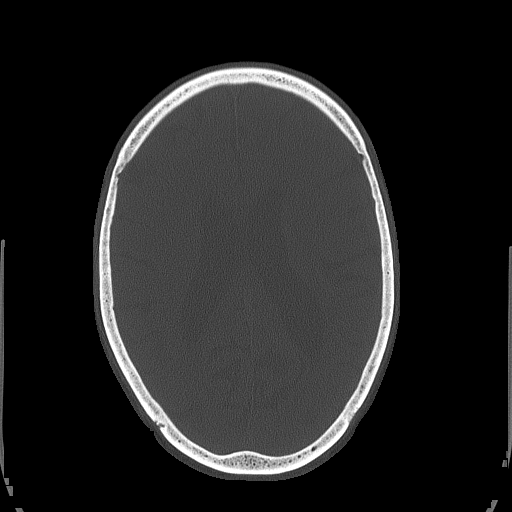
[im 53/76  bone]
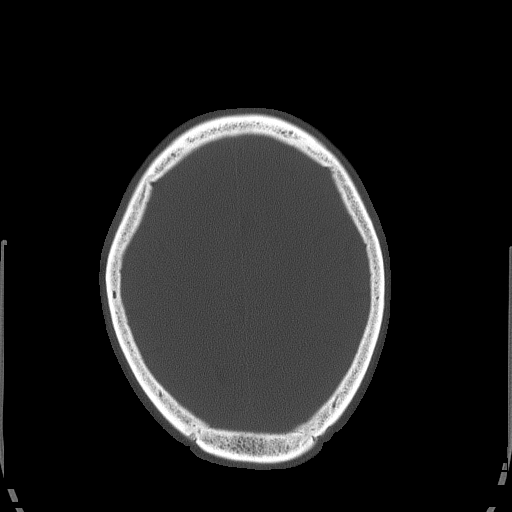
[im 61/76  bone]
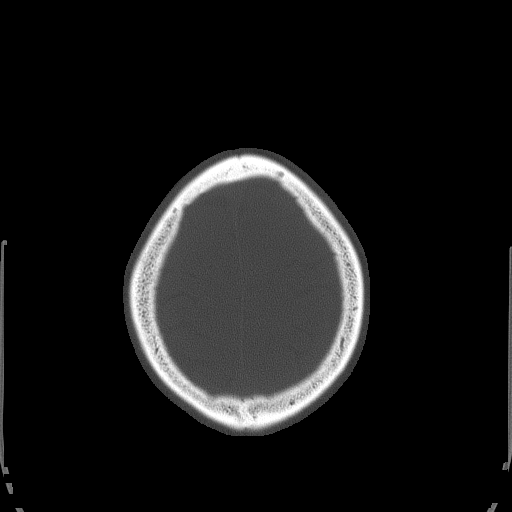
[im 68/76  bone]
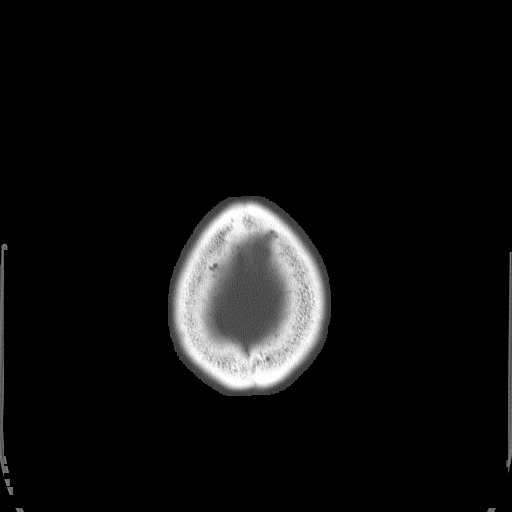

[15 of 30 positions shown; findings below may reference images not displayed]

FINDINGS: Minor paranasal sinus mucosal thickening. Tympanic cavities and
mastoids are clear. No osseous abnormality identified. Visualized
orbits and scalp soft tissues are within normal limits.

Fairly extensive Calcified atherosclerosis at the skull base. Small
chronic lacunar infarcts in the inferior left cerebellum, left PICA
territory. Slightly asymmetric volume loss along the left sylvian
fissure, no definite supratentorial cortical encephalomalacia. No
ventriculomegaly. No midline shift, mass effect, or evidence of
intracranial mass lesion. No acute intracranial hemorrhage
identified. No cortically based acute infarct identified. No
suspicious intracranial vascular hyperdensity.
IMPRESSION: Chronic left PICA territory cerebellar lacunar infarcts. No acute
intracranial abnormality identified.

## 2017-03-17 ENCOUNTER — Telehealth: Payer: Self-pay | Admitting: Internal Medicine

## 2017-03-17 NOTE — Telephone Encounter (Signed)
Spoke with pt's wife, she states someone from Dr. Modesta MessingAndy's office called and told her that her husband was dying and he only had 6 months to live and she was concerned because she feels like he needs to see MW for a sooner consult appt due to him running out of time.   Called Dr. Modesta MessingAndy's office to get clarification of what the conversation was.  Had to leave message for the nurse to call us back.   Leonard LaosAndy, Camille Lee, MD  [address]  [address]  [place], [place] [address]  [phone]  [phone] (Fax)

## 2017-03-18 ENCOUNTER — Telehealth: Payer: Self-pay | Admitting: Internal Medicine

## 2017-03-18 NOTE — Telephone Encounter (Signed)
This was taken care of earlier today. They have already spoken with patient and cleared up any miscommunication.  It looks like we were calling to clarify if anything needed. Per Asher MuirJamie at Dr [name]'s office there is nothing further needed. Their call back to our office was delayed from an earlier call on 5/9. Nothing further needed.  Spoke with    Trudi IdaMabry, Jasmine L, RMA  12:18 PM  Note    Spoke with wife, Dr. Modesta MessingAndy's office called the pt and cleared up and answered what questions she had. She still wanted to see if he can have a sooner appt. New appt was made on 6/8 that is the soonest we could get the pt. She agreed to the appt and had no further questions. Nothing further is needed

## 2017-03-18 NOTE — Telephone Encounter (Signed)
Pt's wife returned phone call.. ert

## 2017-03-18 NOTE — Telephone Encounter (Signed)
Spoke with wife, Dr. Modesta MessingAndy's office called the pt and cleared up and answered what questions she had. She still wanted to see if he can have a sooner appt. New appt was made on 6/8 that is the soonest we could get the pt. She agreed to the appt and had no further questions. Nothing further is needed

## 2017-04-17 ENCOUNTER — Institutional Professional Consult (permissible substitution): Payer: Managed Care, Other (non HMO) | Admitting: Internal Medicine

## 2017-04-28 ENCOUNTER — Institutional Professional Consult (permissible substitution): Payer: Managed Care, Other (non HMO) | Admitting: Internal Medicine

## 2017-05-25 ENCOUNTER — Ambulatory Visit (INDEPENDENT_AMBULATORY_CARE_PROVIDER_SITE_OTHER): Payer: Medicare Other | Admitting: Internal Medicine

## 2017-05-25 ENCOUNTER — Encounter: Payer: Self-pay | Admitting: Internal Medicine

## 2017-05-25 ENCOUNTER — Ambulatory Visit (INDEPENDENT_AMBULATORY_CARE_PROVIDER_SITE_OTHER)
Admission: RE | Admit: 2017-05-25 | Discharge: 2017-05-25 | Disposition: A | Discharge status: Home / Self Care | Payer: Medicare Other | Source: Ambulatory Visit | Attending: Internal Medicine | Admitting: Internal Medicine

## 2017-05-25 VITALS — BP 122/82 | HR 107 | Ht 64.0 in | Wt 110.0 lb

## 2017-05-25 DIAGNOSIS — J449 Chronic obstructive pulmonary disease, unspecified: Secondary | ICD-10-CM

## 2017-05-25 DIAGNOSIS — J9612 Chronic respiratory failure with hypercapnia: Secondary | ICD-10-CM

## 2017-05-25 DIAGNOSIS — J9611 Chronic respiratory failure with hypoxia: Secondary | ICD-10-CM | POA: Diagnosis not present

## 2017-05-25 MED ORDER — PREDNISONE 10 MG PO TABS: ORAL_TABLET | ORAL | 0 refills | Status: AC

## 2017-05-25 MED ORDER — FLUTICASONE-UMECLIDIN-VILANT 100-62.5-25 MCG/INH IN AEPB: 1.0000 | INHALATION_SPRAY | Freq: Every day | RESPIRATORY_TRACT | 0 refills | Status: AC

## 2017-05-25 NOTE — Progress Notes (Signed)
Subjective:     Patient ID: [name], male   DOB: 08-14-1951,     MRN: [id number]  HPI  66 yo [place] in [place] 1986 and stopped smoking 02/2016 after trouble of breathing x several years with less coughing since stopped but no better breathing unless on prednisone despite bevespi referred to pulmonary clinic 05/25/2017 by Dr   Quincy Simmonds s/p admit 02/2016  Admit date: 02/15/2016 Discharge date: 02/19/2016   Discharge Diagnoses:  Active Problems:   Hyperlipemia   CAD (coronary artery disease)   COPD (chronic obstructive pulmonary disease) (HCC)   PNA (pneumonia)   COPD exacerbation (HCC)   Protein-calorie malnutrition, severe   Discharge Condition: stable  Diet recommendation: low sodium heart healthy diet       Filed Weights   02/17/16 0616 02/18/16 0335 02/19/16 0419  Weight: 49.442 kg (109 lb) 50.032 kg (110 lb 4.8 oz) 50.758 kg (111 lb 14.4 oz)    History of present illness:  66 year old [place] male with reported history of heavy smoking and history of remote pneumonia who presented to the hospital complaining of shortness of breath. Diagnosed with COPD exacerbation.  Hospital Course:  COPD exacerbation -Patient received roughly 5 days of steroid treatment with improvement in condition. No antibiotics were used and patient improved without antibiotics. -Recommend follow-up with primary care physician for further evaluation including PFTs as outpatient - We'll discharge with prescription for albuterol  Essential hypertension -Not well controlled as such will place patient on amlodipine on discharge  Procedures:  None  Consultations:  None  Discharge Exam:     Filed Vitals:   02/19/16 0800 02/19/16 1433  BP: 142/85 173/88  Pulse: 91 100  Temp: 98.3 F (36.8 C) 98.1 F (36.7 C)  Resp: 17 20    General: Patient in no acute distress, alert and awake Cardiovascular: Regular rate and rhythm, no murmurs or rubs Respiratory: No  increased work of breathing, no wheezes  Discharge Instructions       Discharge Instructions    Call MD for:  temperature >100.4    Complete by:  As directed      Diet - low sodium heart healthy    Complete by:  As directed      Discharge instructions    Complete by:  As directed   Please be sure to follow up with your primary care physician in 1-2 weeks or sooner should any new concerns arise.     Increase activity slowly    Complete by:  As directed              Current Discharge Medication List    START taking these medications   Details  albuterol (PROVENTIL HFA;VENTOLIN HFA) 108 (90 Base) MCG/ACT inhaler Inhale 2 puffs into the lungs every 6 (six) hours as needed for wheezing or shortness of breath. Qty: 1 Inhaler, Refills: 0    amLODipine (NORVASC) 5 MG tablet Take 1 tablet (5 mg total) by mouth daily. Qty: 30 tablet, Refills: 0    aspirin EC 81 MG EC tablet Take 1 tablet (81 mg total) by mouth daily. Qty: 30 tablet, Refills: 0    nicotine (NICODERM CQ - DOSED IN MG/24 HOURS) 21 mg/24hr patch Place 1 patch (21 mg total) onto the skin daily. Qty: 28 patch, Refills: 0          05/25/2017 1st [facility] office visit/ [name]   Chief Complaint  Patient presents with  . Pulmonary Consult    Referred  by Dr. Asencion Partridgeamille [name] for eval of COPD. Pt c/o SOB for the past 2 yrs, worse over the past few months. He is SOB with minimal exertion. He also c/o CP for the past 2 wks.   first started on 02 x "months" and now on hospice for copd  Best days "can do anything" while on prednisone 20  but very hard to be sure what that means/ worse days sob at rest x one year   Presently on prednisone 10 mg daily and bevespi 2bid but very poor hfa noted (see a/p)   No obvious day to day or daytime variability or assoc excess/ purulent sputum or mucus plugs or hemoptysis or cp or chest tightness, subjective wheeze or overt sinus or hb symptoms. No unusual exp hx  or h/o childhood pna/ asthma or knowledge of premature birth.  Sleeping ok without nocturnal  or early am exacerbation  of respiratory  c/o's or need for noct saba. Also denies any obvious fluctuation of symptoms with weather or environmental changes or other aggravating or alleviating factors except as outlined above   Current Medications, Allergies, Complete Past Medical History, Past Surgical History, Family History, and Social History were reviewed in [facility] electronic medical record.  ROS  The following are not active complaints unless bolded sore throat, dysphagia, dental problems, itching, sneezing,  nasal congestion or excess/ purulent secretions, ear ache,   fever, chills, sweats, unintended wt loss, classically pleuritic or exertional cp,  orthopnea pnd or leg swelling, presyncope, palpitations, abdominal pain, anorexia, nausea, vomiting, diarrhea  or change in bowel or bladder habits, change in stools or urine, dysuria,hematuria,  rash, arthralgias, visual complaints, headache, numbness, weakness or ataxia or problems with walking or coordination,  change in mood/affect or memory.                Review of Systems     Objective:   Physical Exam    amb vietnamese thin male nad on 02   Wt Readings from Last 3 Encounters:  05/25/17 110 lb (49.9 kg)  02/19/16 111 lb 14.4 oz (50.8 kg)  12/01/07 130 lb (59 kg)    Vital signs reviewed - Note on arrival 02 sats  96% on 2lpm      HEENT: nl dentition, turbinates bilaterally, and oropharynx. Nl external ear canals without cough reflex   NECK :  without JVD/Nodes/TM/ nl carotid upstrokes bilaterally   LUNGS: no acc muscle use,  slt barrel contour with distant bs/ no wheeze    CV:  RRR  no s3 or murmur or increase in P2, and no edema   ABD:  soft and nontender with nl inspiratory excursion in the supine position. No bruits or organomegaly appreciated, bowel sounds nl  MS:  Nl gait/ ext warm without deformities,  calf tenderness, cyanosis or clubbing No obvious joint restrictions   SKIN: warm and dry without lesions    NEURO:  alert, approp, nl sensorium with  no motor or cerebellar deficits apparent.     CXR PA and Lateral:   05/25/2017 :    I personally reviewed images and agree with radiology impression as follows:    copd with mod hyperinflation plus relative cm/ no acute changes     Assessment:

## 2017-05-25 NOTE — Patient Instructions (Addendum)
Plan A = Automatic = Trelegy on click each am and take 2 good drags and prednisone 20 mg per day until better then 10 mg daily until return   Stop bevespi  Plan B = Backup Only use your albuterol (Ventolin)  as a rescue medication to be used if you can't catch your breath by resting or doing a relaxed purse lip breathing pattern.  - The less you use it, the better it will work when you need it. - Ok to use the inhaler up to 2 puffs  every 4 hours if you must but call for appointment if use goes up over your usual need - Don't leave home without it !!  (think of it like the spare tire for your car)   Plan C = Crisis - only use your albuterol nebulizer if you first try Plan B and it fails to help > ok to use the nebulizer up to every 4 hours but if start needing it regularly call for immediate appointment   Please remember to go to the  x-ray department downstairs in the basement  for your tests - we will call you with the results when they are available.  Please schedule a follow up office visit in 6 weeks, call sooner if needed   K? ho?ch A = T? ??ng = Trelegy trn m?i l?n nh?p chu?t v m?t 2 t?t v prednisone 20 mg m?i ngy cho ??n khi t?t h?n sau ? 10 mg m?i ngy cho ??n khi tr? l?i  D?ng bevespi  Gi B = Sao l?u Ch? s? d?ng albuterol (Ventolin) c?a b?n nh? m?t lo?i thu?c c?u h? ???c s? d?ng n?u b?n khng th? b?t ???c h?i th? c?a b?n b?ng cch ngh? ng?i ho?c lm m?t m hnh th? mi v tho?i mi. - B?n cng s? d?ng n cng t th cng t?t khi b?n c?n. - Ok s? d?ng ?ng ht ln ??n 2 l?n m?i 4 gi? n?u b?n ph?i nh?ng hy g?i cho cu?c h?n n?u s? d?ng t?ng ln theo nhu c?u thng th??ng c?a b?n - ??ng r?i kh?i nh m khng c n !! (ngh? v? n nh? l?p d? phng cho chi?c xe c?a b?n)  K? ho?ch C = Kh?ng ho?ng ok to use the nebulizer up to every 4 hours but if start needing it regularly call for immediate appointment   "- ch? s? d?ng my phun s??ng albuterol c?a b?n n?u b?n l?n ??u tin th?  K? ho?ch B v n khng gip ???c> ok ?? s? d?ng my phun s??ng ln ??n 4 gi? m?t l?n nh?ng n?u b?t ??u c?n n th??ng xuyn g?i cho cu?c h?n tr??c   Hy nh? ?i ??n phng ch?p x-quang ? t?ng d??i trong t?ng h?m ?? ki?m tra - chng ti s? g?i cho b?n v?i k?t qu? khi chng c s?n.  Vui lng ln l?ch khm theo di trong vng 6 tu?n, g?i s?m h?n n?u c?n   Suggest an edit    About [employer]  About GooglePrivacy & TermsHelpSend feedback

## 2017-05-26 ENCOUNTER — Telehealth: Payer: Self-pay | Admitting: Internal Medicine

## 2017-05-26 DIAGNOSIS — J9612 Chronic respiratory failure with hypercapnia: Secondary | ICD-10-CM

## 2017-05-26 DIAGNOSIS — J9611 Chronic respiratory failure with hypoxia: Secondary | ICD-10-CM | POA: Insufficient documentation

## 2017-05-26 MED ORDER — IPRATROPIUM-ALBUTEROL 0.5-2.5 (3) MG/3ML IN SOLN: 3.0000 mL | RESPIRATORY_TRACT | Status: AC | PRN

## 2017-05-26 NOTE — Progress Notes (Signed)
LMTCB

## 2017-05-26 NOTE — Telephone Encounter (Signed)
Created in error. Will sign off.

## 2017-05-26 NOTE — Telephone Encounter (Signed)
Notes recorded by Nyoka CowdenWert, Michael B, MD on 05/26/2017 at 8:48 AM EDT Call pt: Reviewed cxr and no acute change so [facility] --------- Spoke with pt's spouse (dpr on file), aware of results/recs.  Nothing further needed.

## 2017-05-26 NOTE — Assessment & Plan Note (Signed)
HC03   02/2016  = 31   The elevation in hco3 is a bad prognostic sign in general but note: Though somewhat paradoxic, when the lung fails to clear C02 properly and pC02 rises the lung then becomes a more efficient scavenger of C02 allowing lower work of breathing and  better C02 clearance albeit at a higher serum pC02 level - this is why pts can look a lot better than their ABG's would suggest and why it's so difficult to prognosticate endstage dz.  It's also why I strongly rec DNI status (ventilating pts down to a nl pC02 adversely affects this compensatory mechanism)   Agree with hospice/ ncb status but in meantime will see what we can do in this clinic to address the underlying issues

## 2017-05-26 NOTE — Assessment & Plan Note (Signed)
Spirometry 05/25/2017  FEV1 0.42 (18%)  Ratio 92 with non-physiologic f/v curve and only 3 sec exp  - 05/25/2017  After extensive coaching device effectiveness =   75% on dpi/ 0 % on hfa  > try trelegy one each am  He clearly has severe dz but if his hx it to be believed it just came on in the last few years and is worse since he quit smoking but highly responsive to pred and the reason he may be doing so poorly may all relate to the following  DDX of  difficult airways management almost all start with A and  include Adherence, Ace Inhibitors, Acid Reflux, Active Sinus Disease, Alpha 1 Antitripsin deficiency, Anxiety masquerading as Airways dz,  ABPA,  Allergy(esp in young), Aspiration (esp in elderly), Adverse effects of meds,  Active smokers, A bunch of PE's (a small clot burden can't cause this syndrome unless there is already severe underlying pulm or vascular dz with poor reserve) plus two Bs  = Bronchiectasis and Beta blocker use..and one C= CHF  Adherence is always the initial "prime suspect" and is a multilayered concern that requires a "trust but verify" approach in every patient - starting with knowing how to use medications, especially inhalers, correctly, keeping up with refills and understanding the fundamental difference between maintenance and prns vs those medications only taken for a very short course and then stopped and not refilled.  - see device teaching - instructions given in vietnamese which he says he can read  ?allergy/asthma component > The goal with a chronic steroid dependent illness is always arriving at the lowest effective dose that controls the disease/symptoms and not accepting a set "formula" which is based on statistics or guidelines that don't always take into account patient  variability or the natural hx of the dz in every individual patient, which may well vary over time.  For now therefore I recommend the patient maintain  20 mg as a ceiling and 10 mg per day as a  floor until returns  ? Anxiety/depression  > usually at the bottom of this list of usual suspects but should be much higher on this pt's based on H and P  > Follow up per Primary Care planned    ? Active smoking still > denies but hard to confirm  ? chf > note CM on cxr ? Cor pulmonale component > will explore at f/u .   Total time devoted to counseling  > 50 % of initial 60 min office visit:  review case with pt/x -wife,  discussion of options/alternatives/ personally creating written customized instructions  in presence of pt  then going over those specific  Instructions directly with the pt including how to use all of the meds but in particular covering each new medication in detail and the difference between the maintenance= "automatic" meds and the prns using an action plan format for the latter (If this problem/symptom => do that organization reading Left to right).  Please see AVS from this visit for a full list of these instructions which I personally wrote for this pt and  are unique to this visit and were translated into vietnamese by [employer] translator.

## 2017-05-28 ENCOUNTER — Telehealth: Payer: Self-pay | Admitting: Internal Medicine

## 2017-05-28 NOTE — Telephone Encounter (Signed)
Fine with me

## 2017-05-28 NOTE — Telephone Encounter (Signed)
Spoke with patient's wife and informed her MW gave the ok. First available consult appt was made. She had no additional questions at this time. Nothing further is needed

## 2017-05-28 NOTE — Telephone Encounter (Signed)
Called and spoke to pt's wife, Amy. She states the pt is requesting to change providers from MW to another provider. Amy did not say which provider the pt would like to change to. Once message is received back from MW we can see when another provider has a first available.   Dr. Sherene SiresWert please advise if you are ok with the change. Thanks.

## 2017-06-10 DEATH — deceased

## 2017-07-06 ENCOUNTER — Ambulatory Visit: Payer: Medicare Other | Admitting: Internal Medicine

## 2017-07-27 ENCOUNTER — Institutional Professional Consult (permissible substitution): Payer: Medicare Other | Admitting: Internal Medicine
